# Patient Record
Sex: Male | Born: 1955 | Race: White | Hispanic: No | Marital: Single | Smoking: Former smoker
Health system: Southern US, Community
[De-identification: ages and names within clinical notes are randomized; demographics above are authoritative.]

## PROBLEM LIST (undated history)

## (undated) DIAGNOSIS — N400 Enlarged prostate without lower urinary tract symptoms: Secondary | ICD-10-CM

## (undated) DIAGNOSIS — I2510 Atherosclerotic heart disease of native coronary artery without angina pectoris: Secondary | ICD-10-CM

## (undated) DIAGNOSIS — I255 Ischemic cardiomyopathy: Secondary | ICD-10-CM

## (undated) DIAGNOSIS — C801 Malignant (primary) neoplasm, unspecified: Secondary | ICD-10-CM

## (undated) DIAGNOSIS — Z9289 Personal history of other medical treatment: Secondary | ICD-10-CM

## (undated) DIAGNOSIS — I480 Paroxysmal atrial fibrillation: Secondary | ICD-10-CM

## (undated) DIAGNOSIS — I714 Abdominal aortic aneurysm, without rupture, unspecified: Secondary | ICD-10-CM

## (undated) DIAGNOSIS — N189 Chronic kidney disease, unspecified: Secondary | ICD-10-CM

## (undated) DIAGNOSIS — E7800 Pure hypercholesterolemia, unspecified: Secondary | ICD-10-CM

## (undated) DIAGNOSIS — I219 Acute myocardial infarction, unspecified: Secondary | ICD-10-CM

## (undated) DIAGNOSIS — I10 Essential (primary) hypertension: Secondary | ICD-10-CM

## (undated) DIAGNOSIS — I509 Heart failure, unspecified: Secondary | ICD-10-CM

## (undated) DIAGNOSIS — D689 Coagulation defect, unspecified: Secondary | ICD-10-CM

## (undated) HISTORY — PX: VSD REPAIR: SHX276

## (undated) HISTORY — PX: CARDIAC CATHETERIZATION: SHX172

## (undated) HISTORY — DX: Abdominal aortic aneurysm, without rupture, unspecified: I71.40

## (undated) HISTORY — DX: Personal history of other medical treatment: Z92.89

## (undated) HISTORY — PX: CORONARY STENT PLACEMENT: SHX1402

## (undated) HISTORY — PX: CATARACT EXTRACTION W/ INTRAOCULAR LENS IMPLANT: SHX1309

## (undated) HISTORY — DX: Coagulation defect, unspecified: D68.9

## (undated) HISTORY — DX: Acute myocardial infarction, unspecified: I21.9

## (undated) HISTORY — PX: ASD REPAIR: SHX258

## (undated) HISTORY — PX: OTHER SURGICAL HISTORY: SHX169

---

## 2000-11-05 ENCOUNTER — Ambulatory Visit (HOSPITAL_COMMUNITY): Admission: RE | Admit: 2000-11-05 | Discharge: 2000-11-05 | Payer: Self-pay | Admitting: Family Medicine

## 2000-11-05 ENCOUNTER — Encounter: Payer: Self-pay | Admitting: Family Medicine

## 2000-11-25 ENCOUNTER — Encounter: Payer: Self-pay | Admitting: Family Medicine

## 2000-11-25 ENCOUNTER — Ambulatory Visit (HOSPITAL_COMMUNITY): Admission: RE | Admit: 2000-11-25 | Discharge: 2000-11-25 | Payer: Self-pay | Admitting: Family Medicine

## 2002-05-30 ENCOUNTER — Ambulatory Visit (HOSPITAL_COMMUNITY): Admission: RE | Admit: 2002-05-30 | Discharge: 2002-05-30 | Payer: Self-pay | Admitting: Family Medicine

## 2002-05-30 ENCOUNTER — Encounter: Payer: Self-pay | Admitting: Family Medicine

## 2004-02-29 ENCOUNTER — Ambulatory Visit (HOSPITAL_COMMUNITY): Admission: RE | Admit: 2004-02-29 | Discharge: 2004-02-29 | Payer: Self-pay | Admitting: Gastroenterology

## 2011-01-13 ENCOUNTER — Inpatient Hospital Stay (HOSPITAL_COMMUNITY)
Admission: EM | Admit: 2011-01-13 | Discharge: 2011-01-16 | DRG: 247 | Disposition: A | Discharge status: Home / Self Care | Payer: 59 | Source: Ambulatory Visit | Attending: Cardiovascular Disease | Admitting: Cardiovascular Disease

## 2011-01-13 ENCOUNTER — Emergency Department (HOSPITAL_COMMUNITY): Payer: 59

## 2011-01-13 DIAGNOSIS — E785 Hyperlipidemia, unspecified: Secondary | ICD-10-CM | POA: Diagnosis present

## 2011-01-13 DIAGNOSIS — I10 Essential (primary) hypertension: Secondary | ICD-10-CM | POA: Diagnosis present

## 2011-01-13 DIAGNOSIS — E876 Hypokalemia: Secondary | ICD-10-CM | POA: Diagnosis present

## 2011-01-13 DIAGNOSIS — R0609 Other forms of dyspnea: Secondary | ICD-10-CM | POA: Diagnosis present

## 2011-01-13 DIAGNOSIS — I219 Acute myocardial infarction, unspecified: Secondary | ICD-10-CM

## 2011-01-13 DIAGNOSIS — F17200 Nicotine dependence, unspecified, uncomplicated: Secondary | ICD-10-CM | POA: Diagnosis present

## 2011-01-13 DIAGNOSIS — I2109 ST elevation (STEMI) myocardial infarction involving other coronary artery of anterior wall: Principal | ICD-10-CM | POA: Diagnosis present

## 2011-01-13 DIAGNOSIS — R0989 Other specified symptoms and signs involving the circulatory and respiratory systems: Secondary | ICD-10-CM | POA: Diagnosis present

## 2011-01-13 DIAGNOSIS — R110 Nausea: Secondary | ICD-10-CM | POA: Diagnosis present

## 2011-01-13 HISTORY — DX: Acute myocardial infarction, unspecified: I21.9

## 2011-01-13 LAB — CBC
HCT: 46.5 % (ref 39.0–52.0)
Hemoglobin: 16.9 g/dL (ref 13.0–17.0)
MCH: 34.3 pg — ABNORMAL HIGH (ref 26.0–34.0)
MCHC: 36.3 g/dL — ABNORMAL HIGH (ref 30.0–36.0)
MCV: 94.3 fL (ref 78.0–100.0)
Platelets: 236 10*3/uL (ref 150–400)
RBC: 4.93 MIL/uL (ref 4.22–5.81)
RDW: 13.2 % (ref 11.5–15.5)
WBC: 9.3 10*3/uL (ref 4.0–10.5)

## 2011-01-13 LAB — DIFFERENTIAL
Basophils Absolute: 0.1 10*3/uL (ref 0.0–0.1)
Basophils Relative: 1 % (ref 0–1)
Eosinophils Absolute: 0.2 10*3/uL (ref 0.0–0.7)
Eosinophils Relative: 2 % (ref 0–5)
Lymphocytes Relative: 29 % (ref 12–46)
Lymphs Abs: 2.7 10*3/uL (ref 0.7–4.0)
Monocytes Absolute: 1 10*3/uL (ref 0.1–1.0)
Monocytes Relative: 11 % (ref 3–12)
Neutro Abs: 5.4 10*3/uL (ref 1.7–7.7)
Neutrophils Relative %: 58 % (ref 43–77)

## 2011-01-13 LAB — BASIC METABOLIC PANEL
BUN: 8 mg/dL (ref 6–23)
CO2: 26 mEq/L (ref 19–32)
Calcium: 9 mg/dL (ref 8.4–10.5)
Chloride: 90 mEq/L — ABNORMAL LOW (ref 96–112)
Creatinine, Ser: 0.66 mg/dL (ref 0.50–1.35)
GFR calc Af Amer: >90 mL/min (ref >90)
GFR calc non Af Amer: >90 mL/min (ref >90)
Glucose, Bld: 121 mg/dL — ABNORMAL HIGH (ref 70–99)
Potassium: 3.2 mEq/L — ABNORMAL LOW (ref 3.5–5.1)
Sodium: 128 mEq/L — ABNORMAL LOW (ref 135–145)

## 2011-01-13 LAB — CARDIAC PANEL(CRET KIN+CKTOT+MB+TROPI)
CK, MB: 110.7 ng/mL (ref 0.3–4.0)
Relative Index: 5.8 — ABNORMAL HIGH (ref 0.0–2.5)
Relative Index: 4.2 — ABNORMAL HIGH (ref 0.0–2.5)
Total CK: 3526 U/L — ABNORMAL HIGH (ref 7–232)
Troponin I: >25 ng/mL (ref <0.30)
Troponin I: >25 ng/mL (ref <0.30)

## 2011-01-13 LAB — HEMOGLOBIN A1C
Hgb A1c MFr Bld: 5.5 % (ref <5.7)
Mean Plasma Glucose: 111 mg/dL (ref <117)

## 2011-01-13 LAB — MAGNESIUM: Magnesium: 1.7 mg/dL (ref 1.5–2.5)

## 2011-01-13 LAB — APTT: aPTT: 28 seconds (ref 24–37)

## 2011-01-13 LAB — RENAL FUNCTION PANEL
Albumin: 3.8 g/dL (ref 3.5–5.2)
BUN: 7 mg/dL (ref 6–23)
Calcium: 9.2 mg/dL (ref 8.4–10.5)
Creatinine, Ser: 0.54 mg/dL (ref 0.50–1.35)
GFR calc non Af Amer: >90 mL/min (ref >90)

## 2011-01-13 LAB — PROTIME-INR
INR: 0.91 (ref 0.00–1.49)
Prothrombin Time: 12.4 seconds (ref 11.6–15.2)

## 2011-01-13 LAB — MRSA PCR SCREENING: MRSA by PCR: NEGATIVE

## 2011-01-13 LAB — LIPID PANEL: Cholesterol: 192 mg/dL (ref 0–200)

## 2011-01-13 LAB — PRO B NATRIURETIC PEPTIDE: Pro B Natriuretic peptide (BNP): 117.2 pg/mL (ref 0–125)

## 2011-01-14 LAB — CBC
HCT: 46.7 % (ref 39.0–52.0)
MCH: 33.9 pg (ref 26.0–34.0)
MCV: 94.7 fL (ref 78.0–100.0)
Platelets: 210 10*3/uL (ref 150–400)
RBC: 4.93 MIL/uL (ref 4.22–5.81)
WBC: 9.9 10*3/uL (ref 4.0–10.5)

## 2011-01-14 LAB — RENAL FUNCTION PANEL
BUN: 7 mg/dL (ref 6–23)
CO2: 24 mEq/L (ref 19–32)
Calcium: 9.2 mg/dL (ref 8.4–10.5)
Chloride: 98 mEq/L (ref 96–112)
Creatinine, Ser: 0.72 mg/dL (ref 0.50–1.35)

## 2011-01-14 LAB — CARDIAC PANEL(CRET KIN+CKTOT+MB+TROPI)
CK, MB: 45.9 ng/mL (ref 0.3–4.0)
Total CK: 1495 U/L — ABNORMAL HIGH (ref 7–232)

## 2011-01-15 ENCOUNTER — Inpatient Hospital Stay (HOSPITAL_COMMUNITY): Payer: 59

## 2011-01-15 LAB — BASIC METABOLIC PANEL
BUN: 9 mg/dL (ref 6–23)
CO2: 25 mEq/L (ref 19–32)
Calcium: 9 mg/dL (ref 8.4–10.5)
Creatinine, Ser: 0.73 mg/dL (ref 0.50–1.35)
GFR calc non Af Amer: >90 mL/min (ref >90)
Glucose, Bld: 132 mg/dL — ABNORMAL HIGH (ref 70–99)

## 2011-01-15 LAB — CBC
HCT: 46.1 % (ref 39.0–52.0)
Hemoglobin: 15.6 g/dL (ref 13.0–17.0)
MCH: 32.2 pg (ref 26.0–34.0)
MCHC: 33.8 g/dL (ref 30.0–36.0)
MCV: 95.1 fL (ref 78.0–100.0)
RBC: 4.85 MIL/uL (ref 4.22–5.81)

## 2011-01-15 LAB — CARDIAC PANEL(CRET KIN+CKTOT+MB+TROPI): CK, MB: 5 ng/mL — ABNORMAL HIGH (ref 0.3–4.0)

## 2011-01-16 NOTE — Cardiovascular Report (Signed)
Cory Valentine NO.:  [id number]  MEDICAL RECORD NO.:  [id number]  LOCATION:  2914                         FACILITY:  [facility]  PHYSICIAN:  Nanetta Batty, M.D.   DATE OF BIRTH:  1955/12/21  DATE OF PROCEDURE:  01/13/2011 DATE OF DISCHARGE:                           CARDIAC CATHETERIZATION   PROCEDURE:  Cardiac catheterization/PCI stent.  Mr. Cory Valentine is a 55 year old divorced Caucasian male with history of tobacco abuse, hypertension, who had chest pain this past Saturday and again this evening.  The pain began at 2 o'clock in the morning.  He was brought by EMS to [facility] where he was found to have an anterolateral myocardial infarction by EKG, and he was brought urgently to the cath lab for cath intervention.  PROCEDURE DESCRIPTION: The patient was brought to the Second Floor [facility] in the postabsorptive state.  He was not premedicated.  His right groin was prepped and shaved in usual sterile fashion.  Xylocaine 1% was used for local anesthesia.  A 6-French sheath was inserted into the right femoral artery using standard Seldinger technique.  6-French right Judkins catheter and one more 6-French XB LAD 3. 5 guiding catheter was used for selective coronary angiography.  A 6-French pigtail catheter was used for left ventriculography.  Visipaque dye was used for the entirety of the case.  Retrograde aortic, left ventricular, and pullback pressures were recorded.  HEMODYNAMICS: 1. Aortic systolic pressure was 79, diastolic pressure 57. 2. Left ventricular systolic pressure 88 and diastolic pressure 31.  SELECTIVE CORONARY ANGIOGRAPHY: 1. Left main normal. 2. LAD; occluded after the first septal perforator. 3. Left circumflex; dominant and free of significant disease. 4. Right coronary artery; nondominant with 60% stenosis in the     midportion after a small marginal branch. 5. Left ventriculography;  RAO left ventriculogram was  performed using     25 mL of Visipaque dye at 12 mL/second.  The overall LVEF was     estimated at 40-45% with severe anteroapical hypokinesia.  IMPRESSION:  Mr. Cory Valentine has an acute anterior wall myocardial infarction .  We will proceed with direct percutaneous coronary intervention and stenting using Angiomax and drug-eluting stent.  PROCEDURE DESCRIPTION:  The patient received 4 baby aspirin en route along with Effient 60 mg in the cath lab and Angiomax bolus with an ACT of 424.  The total contrast administered to the patient was 255 mL of contrast.  Using a 6-French XB LAD 3.5 guiding catheter along with an 0.14 x 190 Asahi soft wire and a 2.5 x 12 angioplasty balloon, pre dilatation was performed.  Door to balloon time was calculated 24 minutes.  Stenting was performed with 3.0 x 18 Resolute drug-eluting stent at 14 atmospheres and postdilatation of the proximal aspect of the stent was done with a 3.5 x 12 noncompliant balloon at 14 atmospheres (3.6 mm).  The final angiographic result with 0% residual with TIMI 3 flow.  The patient's chest pain resolved.  His EKG is somewhat improved. He was somewhat hypotensive during the case and was given IV fluids and low-dose dopamine.  Angiomax was continued until completion of the bag.  IMPRESSION:  Successful  left anterior descending coronary artery percutaneous coronary intervention and stenting in the setting of acute anterior myocardial infarction.  Door to balloon time of about 24 minutes.  The patient will be treated with aspirin, Effient, beta- blocker, ACE inhibitor, statin drug.  Smoking cessation consult will be pursued.  The patient will be hospitalized for approximately 3-4 days.     Nanetta Batty, M.D.     Cordelia Pen  D:  01/13/2011  T:  01/13/2011  Job:  [id number]  cc:   Second [facility] [place] [facility] [facility] [place]. Cory Valentine, M.D.  Electronically Signed by Nanetta Batty M.D. on  01/16/2011 05:27:33 PM

## 2011-03-25 DIAGNOSIS — Z9289 Personal history of other medical treatment: Secondary | ICD-10-CM

## 2011-03-25 HISTORY — PX: TRANSTHORACIC ECHOCARDIOGRAM: SHX275

## 2011-03-25 HISTORY — DX: Personal history of other medical treatment: Z92.89

## 2011-03-25 HISTORY — PX: NM MYOCAR PERF WALL MOTION: HXRAD629

## 2011-05-09 ENCOUNTER — Emergency Department (HOSPITAL_COMMUNITY)
Admission: EM | Admit: 2011-05-09 | Discharge: 2011-05-09 | Disposition: A | Discharge status: Home Health | Payer: 59 | Source: Home / Self Care | Attending: Family Medicine | Admitting: Family Medicine

## 2011-05-09 ENCOUNTER — Encounter (HOSPITAL_COMMUNITY): Payer: Self-pay | Admitting: Emergency Medicine

## 2011-05-09 DIAGNOSIS — B029 Zoster without complications: Secondary | ICD-10-CM

## 2011-05-09 HISTORY — DX: Essential (primary) hypertension: I10

## 2011-05-09 HISTORY — DX: Pure hypercholesterolemia, unspecified: E78.00

## 2011-05-09 MED ORDER — ACETAMINOPHEN-CODEINE #3 300-30 MG PO TABS: 1.0000 | ORAL_TABLET | Freq: Four times a day (QID) | ORAL | Status: AC | PRN

## 2011-05-09 MED ORDER — ACYCLOVIR 5 % EX OINT: TOPICAL_OINTMENT | CUTANEOUS | Status: DC

## 2011-05-09 NOTE — ED Notes (Signed)
Patient reports rash to left side of neck and left scalp.  Patient reports onset 6 days ago.  Reports aching and burning sensation.
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
## 2011-05-09 NOTE — Discharge Instructions (Signed)
GOOD HAND WASHING. AVOID PREGNANT WOMENShingles Shingles is caused by the same virus that causes chickenpox (varicella zoster virus or VZV). Shingles often occurs many years or decades after having chickenpox. That is why it is more common in adults older than 50 years. The virus reactivates and breaks out as an infection in a nerve root. SYMPTOMS   The initial feeling (sensations) may be pain. This pain is usually described as:   Burning.   Stabbing.   Throbbing.   Tingling in the nerve root.   A red rash will follow in a couple days. The rash may occur in any area of the body and is usually on one side (unilateral) of the body in a band or belt-like pattern. The rash usually starts out as very small blisters (vesicles). They will dry up after 7 to 10 days. This is not usually a significant problem except for the pain it causes.   Long-lasting (chronic) pain is more likely in an elderly person. It can last months to years. This condition is called postherpetic neuralgia.  Shingles can be an extremely severe infection in someone with AIDS, a weakened immune system, or with forms of leukemia. It can also be severe if you are taking transplant medicines or other medicines that weaken the immune system. TREATMENT  Your caregiver will often treat you with:  Antiviral drugs.   Anti-inflammatory drugs.   Pain medicines.  Bed rest is very important in preventing the pain associated with herpes zoster (postherpetic neuralgia). Application of heat in the form of a hot water bottle or electric heating pad or gentle pressure with the hand is recommended to help with the pain or discomfort. PREVENTION  A varicella zoster vaccine is available to help protect against the virus. The Food and Drug Administration approved the varicella zoster vaccine for individuals 69 years of age and older. HOME CARE INSTRUCTIONS   Cool compresses to the area of rash may be helpful.   Only take over-the-counter or  prescription medicines for pain, discomfort, or fever as directed by your caregiver.   Avoid contact with:   Babies.   Pregnant women.   Children with eczema.   Elderly people with transplants.   People with chronic illnesses, such as leukemia and AIDS.   If the area involved is on your face, you may receive a referral for follow-up to a specialist. It is very important to keep all follow-up appointments. This will help avoid eye complications, chronic pain, or disability.  SEEK IMMEDIATE MEDICAL CARE IF:   You develop any pain (headache) in the area of the face or eye. This must be followed carefully by your caregiver or ophthalmologist. An infection in part of your eye (cornea) can be very serious. It could lead to blindness.   You do not have pain relief from prescribed medicines.   Your redness or swelling spreads.   The area involved becomes very swollen and painful.   You have a fever.   You notice any red or painful lines extending away from the affected area toward your heart (lymphangitis).   Your condition is worsening or has changed.  Document Released: 03/02/2005 Document Revised: 11/12/2010 Document Reviewed: 02/04/2009 [facility] Patient Information 2012 [place], [place].

## 2012-10-30 ENCOUNTER — Other Ambulatory Visit: Payer: Self-pay | Admitting: Cardiovascular Disease

## 2012-10-31 NOTE — Telephone Encounter (Signed)
Rx was sent to pharmacy electronically. 

## 2012-11-24 ENCOUNTER — Encounter: Payer: Self-pay | Admitting: Cardiovascular Disease

## 2012-11-24 ENCOUNTER — Ambulatory Visit (INDEPENDENT_AMBULATORY_CARE_PROVIDER_SITE_OTHER): Payer: 59 | Admitting: Cardiovascular Disease

## 2012-11-24 VITALS — BP 110/80 | HR 79 | Ht 74.0 in | Wt 207.5 lb

## 2012-11-24 DIAGNOSIS — I2510 Atherosclerotic heart disease of native coronary artery without angina pectoris: Secondary | ICD-10-CM | POA: Insufficient documentation

## 2012-11-24 DIAGNOSIS — Z720 Tobacco use: Secondary | ICD-10-CM | POA: Insufficient documentation

## 2012-11-24 DIAGNOSIS — Z87891 Personal history of nicotine dependence: Secondary | ICD-10-CM

## 2012-11-24 DIAGNOSIS — I10 Essential (primary) hypertension: Secondary | ICD-10-CM | POA: Insufficient documentation

## 2012-11-24 DIAGNOSIS — E785 Hyperlipidemia, unspecified: Secondary | ICD-10-CM | POA: Insufficient documentation

## 2012-11-24 DIAGNOSIS — Z79899 Other long term (current) drug therapy: Secondary | ICD-10-CM

## 2012-11-24 MED ORDER — ATORVASTATIN CALCIUM 20 MG PO TABS: 20.0000 mg | ORAL_TABLET | Freq: Every day | ORAL | Status: DC

## 2012-11-24 NOTE — Assessment & Plan Note (Signed)
Well-controlled on current medications 

## 2012-11-24 NOTE — Assessment & Plan Note (Signed)
Status post anterior ST segment elevation myocardial infarction 01/13/11 it was setting of a "STEMI with an occluded proximal LAD status post stenting with a 3 mm x 18 [place] resolute drug-eluting stent. He had residual disease which was not thought to be significant. He had a left dominant system with an ejection fraction of 40-45% and moderate to severe anteroapical hypokinesia. His CPK was 3500 with an MB fraction of 203. EF improved by 2-D echocardiography 03/25/11-55% with moderate septal hypokinesia and a Myoview stress test showed anteroapical scar without ischemia. He denies chest pain or shortness of breath.

## 2012-11-24 NOTE — Progress Notes (Signed)
11/24/2012 Cory Valentine   [birth year]  [id number]  Primary Physician No PCP Per Patient Primary Cardiologist: Cory Gess MD Cory Valentine   HPI:  The patient is a very pleasant 57 year old thin and fit-appearing Caucasian male with no children who works at [employer]. I last saw him 6 months ago. His risk factors include a 50-pack-year history of tobacco abuse, having quit at the time of his myocardial infarction January 13, 2011, when I brought him to the catheterization lab at 4 a.m. in the setting of acute anterior wall myocardial infarction. I catheterized him, demonstrating an occluded proximal LAD, which I stented using a 3 x 18 mm long Resolute drug-eluting stent. He did have some diagonal branch disease as well as 80% mid nondominant RCA stenosis. His EF at that time was 40% to 45% with moderate to severe anteroapical hypokinesia. His peak CK was 3500 with an MB of 203 and troponin greater than 25. It has been over year since his last lipid profile, and at that time he had an LDL of 140. A followup echo performed March 25, 2011, revealed an EF of greater than 55% with moderate septal hypokinesia, and Myoview stress test showed anteroapical scar without ischemia. He denies chest pain or shortness of breath. We did transition him from Effient to Plavix with a P2Y12 level of 61 (PRU). Since I saw him 6 months ago he's been completely asymptomatic. He has not seen his primary care physician.     Current Outpatient Prescriptions  Medication Sig Dispense Refill  . aspirin 81 MG tablet Take 81 mg by mouth daily.      . clopidogrel (PLAVIX) 75 MG tablet Take 1 tablet by mouth daily.      Marland Kitchen lisinopril (PRINIVIL,ZESTRIL) 10 MG tablet TAKE 1 TABLET EVERY DAY  90 tablet  2  . atorvastatin (LIPITOR) 20 MG tablet Take 1 tablet (20 mg total) by mouth daily.  90 tablet  3   No current facility-administered medications for this visit.    No Known Allergies  History   Social  History  . Marital Status: Married    Spouse Name: N/A    Number of Children: N/A  . Years of Education: N/A   Occupational History  . Not on file.   Social History Main Topics  . Smoking status: Former Smoker -- 1.50 packs/day    Quit date: 01/13/2011  . Smokeless tobacco: Not on file  . Alcohol Use: Yes  . Drug Use: No  . Sexual Activity: Not on file   Other Topics Concern  . Not on file   Social History Narrative  . No narrative on file     Review of Systems: General: negative for chills, fever, night sweats or weight changes.  Cardiovascular: negative for chest pain, dyspnea on exertion, edema, orthopnea, palpitations, paroxysmal nocturnal dyspnea or shortness of breath Dermatological: negative for rash Respiratory: negative for cough or wheezing Urologic: negative for hematuria Abdominal: negative for nausea, vomiting, diarrhea, bright red blood per rectum, melena, or hematemesis Neurologic: negative for visual changes, syncope, or dizziness All other systems reviewed and are otherwise negative except as noted above.    Blood pressure 110/80, pulse 79, height 6\' 2"  (1.88 m), weight 207 lb 8 oz (94.121 kg).  General appearance: alert and no distress Neck: no adenopathy, no carotid bruit, no JVD, supple, symmetrical, trachea midline and thyroid not enlarged, symmetric, no tenderness/mass/nodules Lungs: clear to auscultation bilaterally Heart: regular rate and rhythm, S1, S2 normal,  no murmur, click, rub or gallop Extremities: extremities normal, atraumatic, no cyanosis or edema  EKG normal sinus rhythm at 79 with evidence of an old anterolateral myocardial infarction. He has Q waves in V1 through V4 with J-point elevation and anterolateral T wave inversion.  ASSESSMENT AND PLAN:   Coronary artery disease Status post anterior ST segment elevation myocardial infarction 01/13/11 it was setting of a "STEMI with an occluded proximal LAD status post stenting with a 3 mm x  18 [place] resolute drug-eluting stent. He had residual disease which was not thought to be significant. He had a left dominant system with an ejection fraction of 40-45% and moderate to severe anteroapical hypokinesia. His CPK was 3500 with an MB fraction of 203. EF improved by 2-D echocardiography 03/25/11-55% with moderate septal hypokinesia and a Myoview stress test showed anteroapical scar without ischemia. He denies chest pain or shortness of breath.  Essential hypertension Well-controlled on current medications  Hyperlipidemia Last LDL was 104 a year ago. He is not on a statin drug. I am going to start Lipitor 20 mg a day and recheck a lipid and liver profile in 2 months.      Cory Gess MD FACP,FACC,FAHA, [facility] 11/24/2012 9:05 AM

## 2012-11-24 NOTE — Patient Instructions (Addendum)
  We will see you back in follow up in 6 months with an extender and 1 year with Dr Allyson Sabal  Dr Allyson Sabal has ordered blood work to be done in 2 months, fasting.  Start lipitor 20mg  daily

## 2012-11-24 NOTE — Assessment & Plan Note (Signed)
Last LDL was 104 a year ago. He is not on a statin drug. I am going to start Lipitor 20 mg a day and recheck a lipid and liver profile in 2 months.

## 2012-12-18 IMAGING — CR DG CHEST 1V PORT
1 series · 1 of 1 positions shown · non-contrast
Comparison: None.

CLINICAL DATA: Chest pain

PORTABLE CHEST - 1 VIEW

[AP]
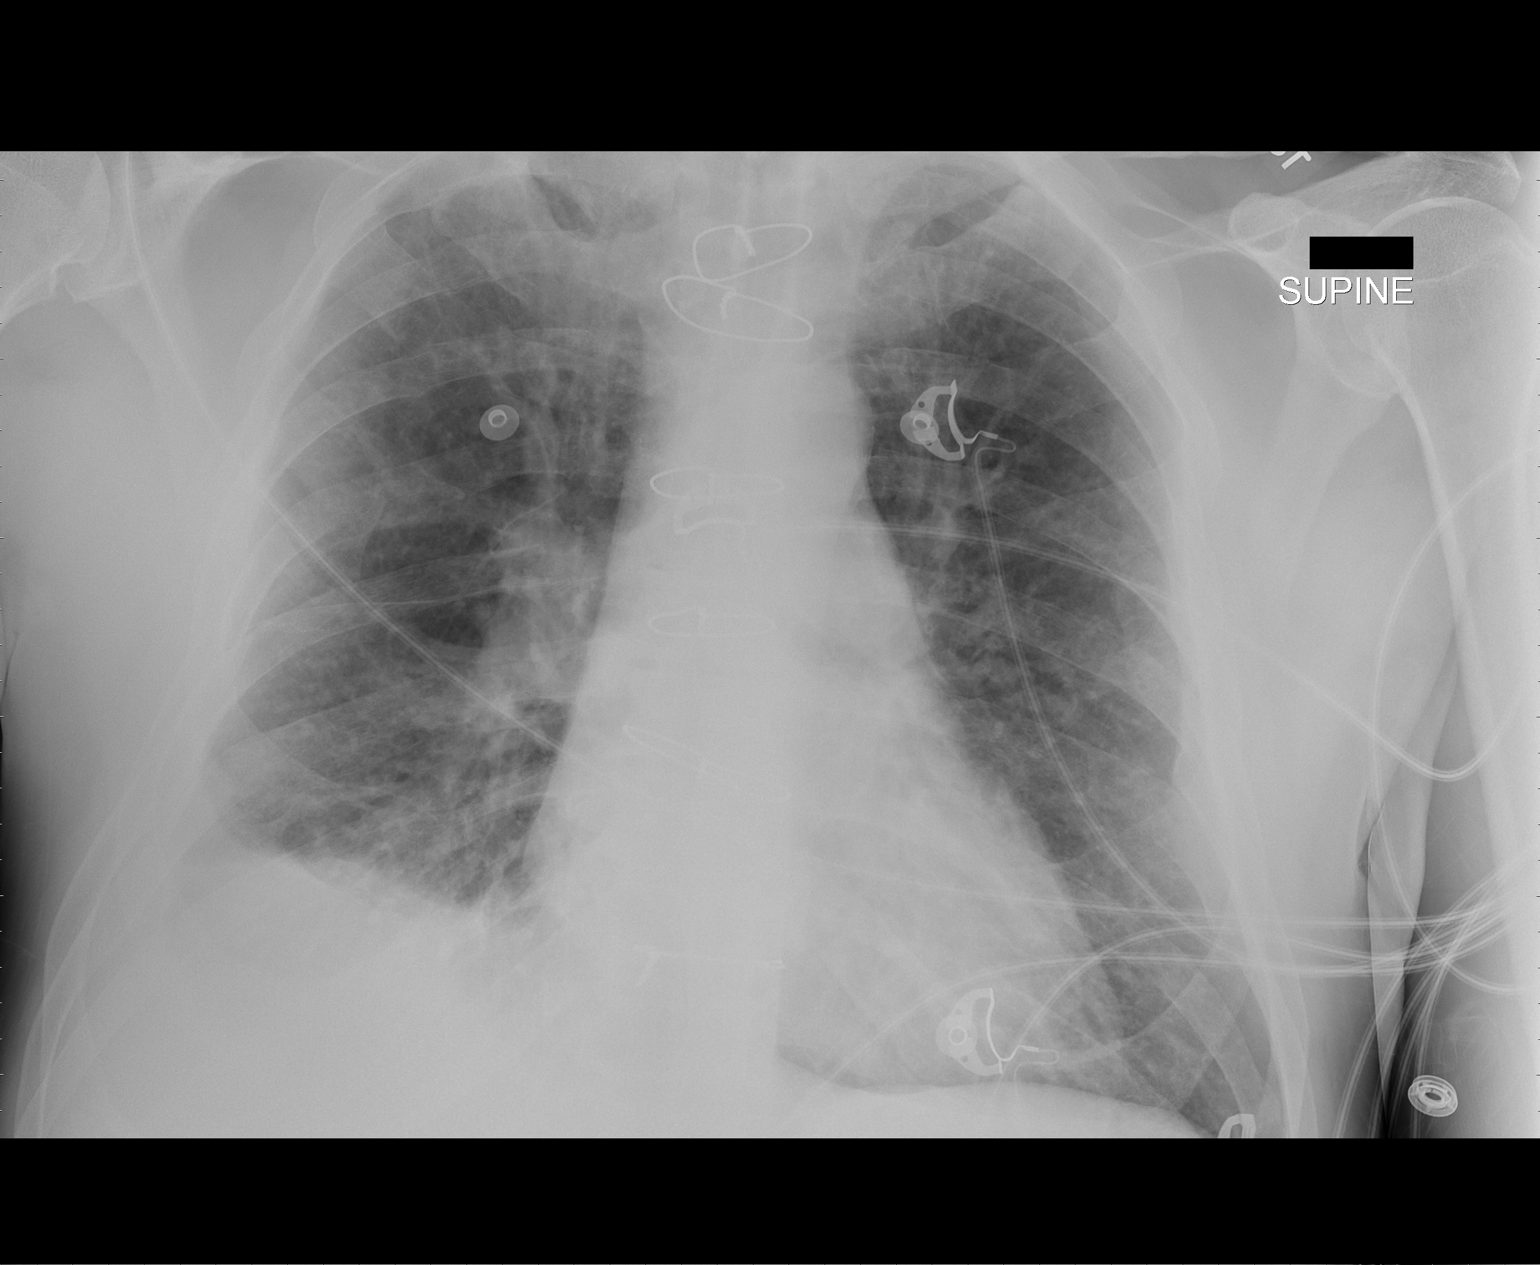

[1 of 1 positions shown; findings below may reference images not displayed]

FINDINGS: Postoperative changes in the mediastinum with sternotomy
wires present.  Old bilateral rib fractures.  Mild elevation of the
right hemidiaphragm with blunting of the right costophrenic angles
suggesting small effusion.  Increased density in the right lung
base may represent early airspace infiltration.  Findings suggest
pneumonia.  No pneumothorax.
IMPRESSION: Volume loss in the right lung with small right pleural effusion and
basilar infiltration or atelectasis.  Pneumonia should be excluded.

## 2012-12-20 IMAGING — CR DG CHEST 2V
2 series · 2 of 2 positions shown · non-contrast
Comparison: 01/13/2011

CLINICAL DATA: Myocardial infarction

CHEST - 2 VIEW

[w chest pa]
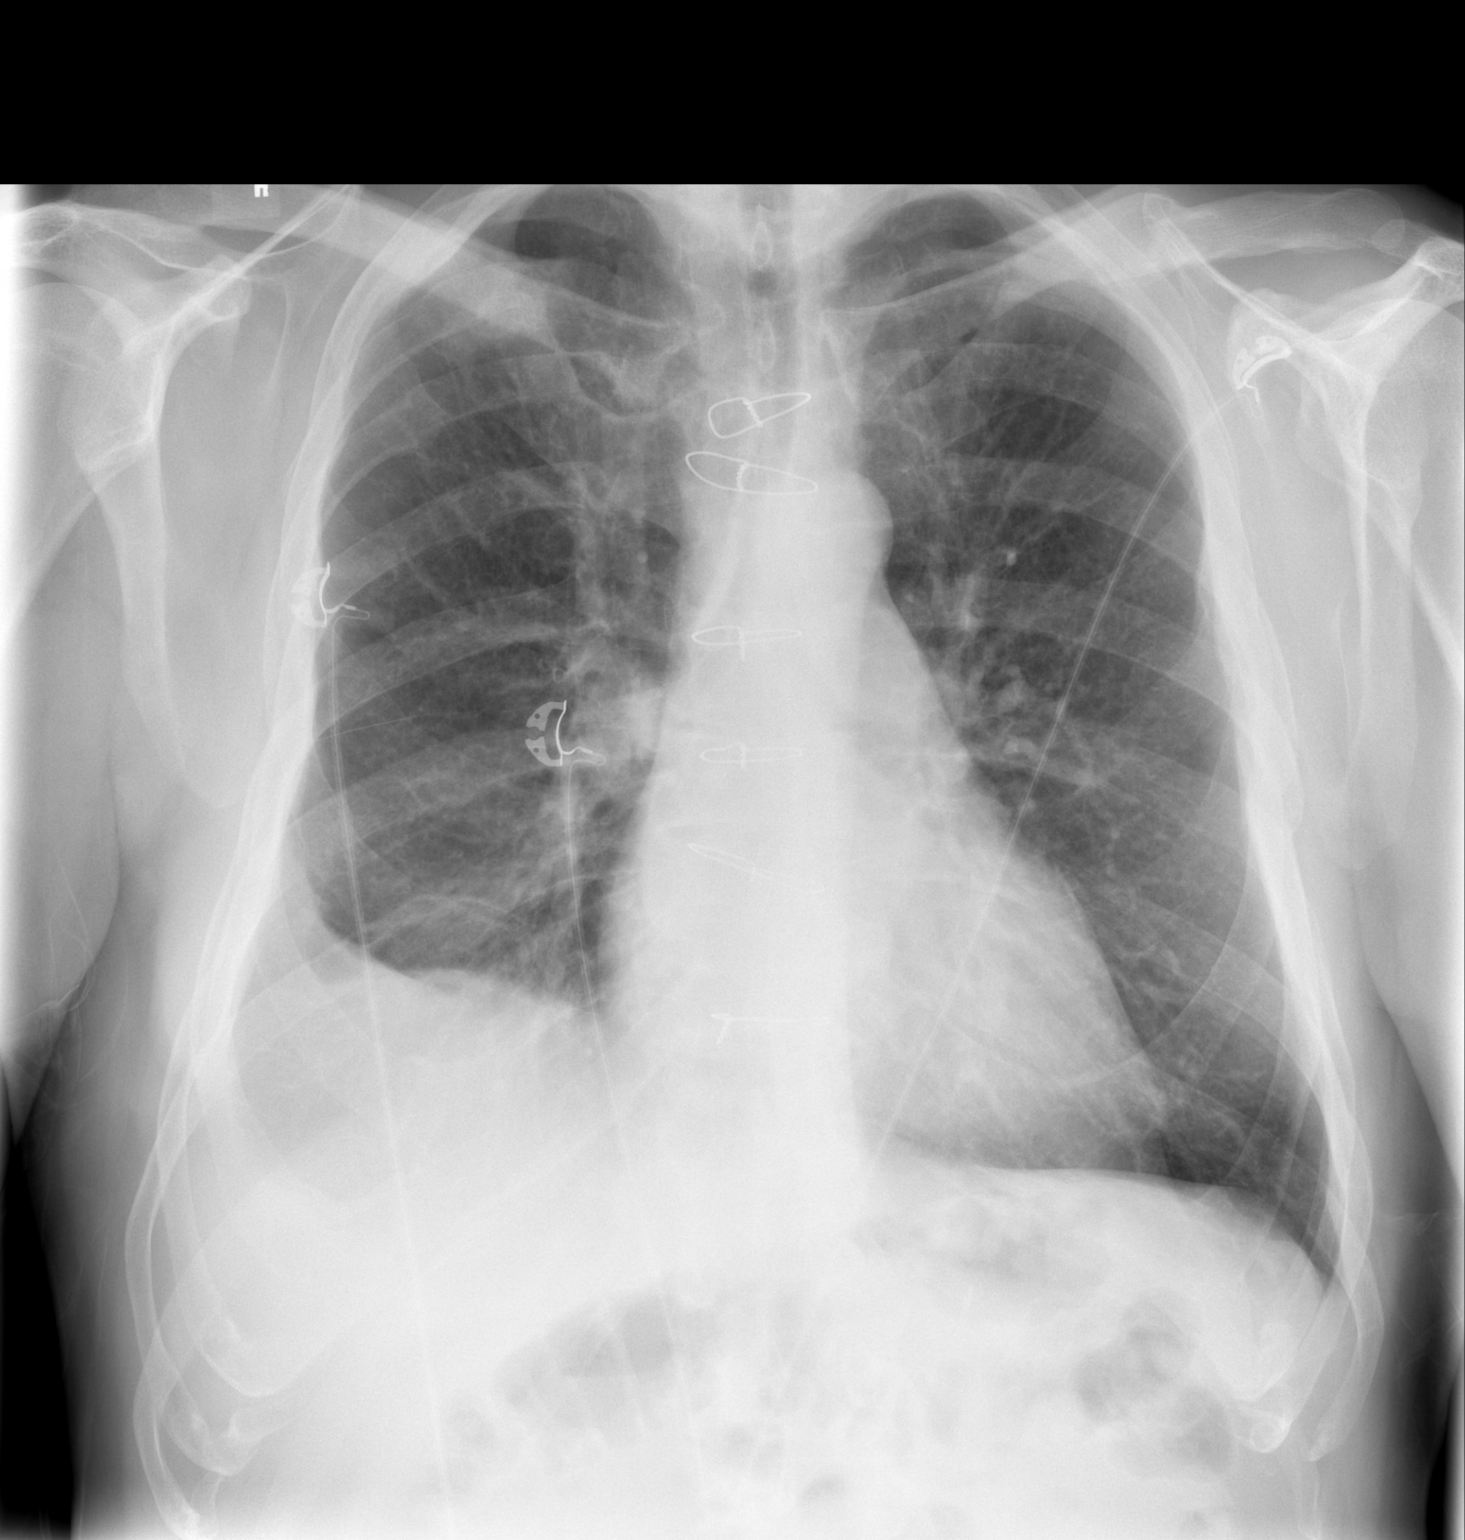

[w chest lat]
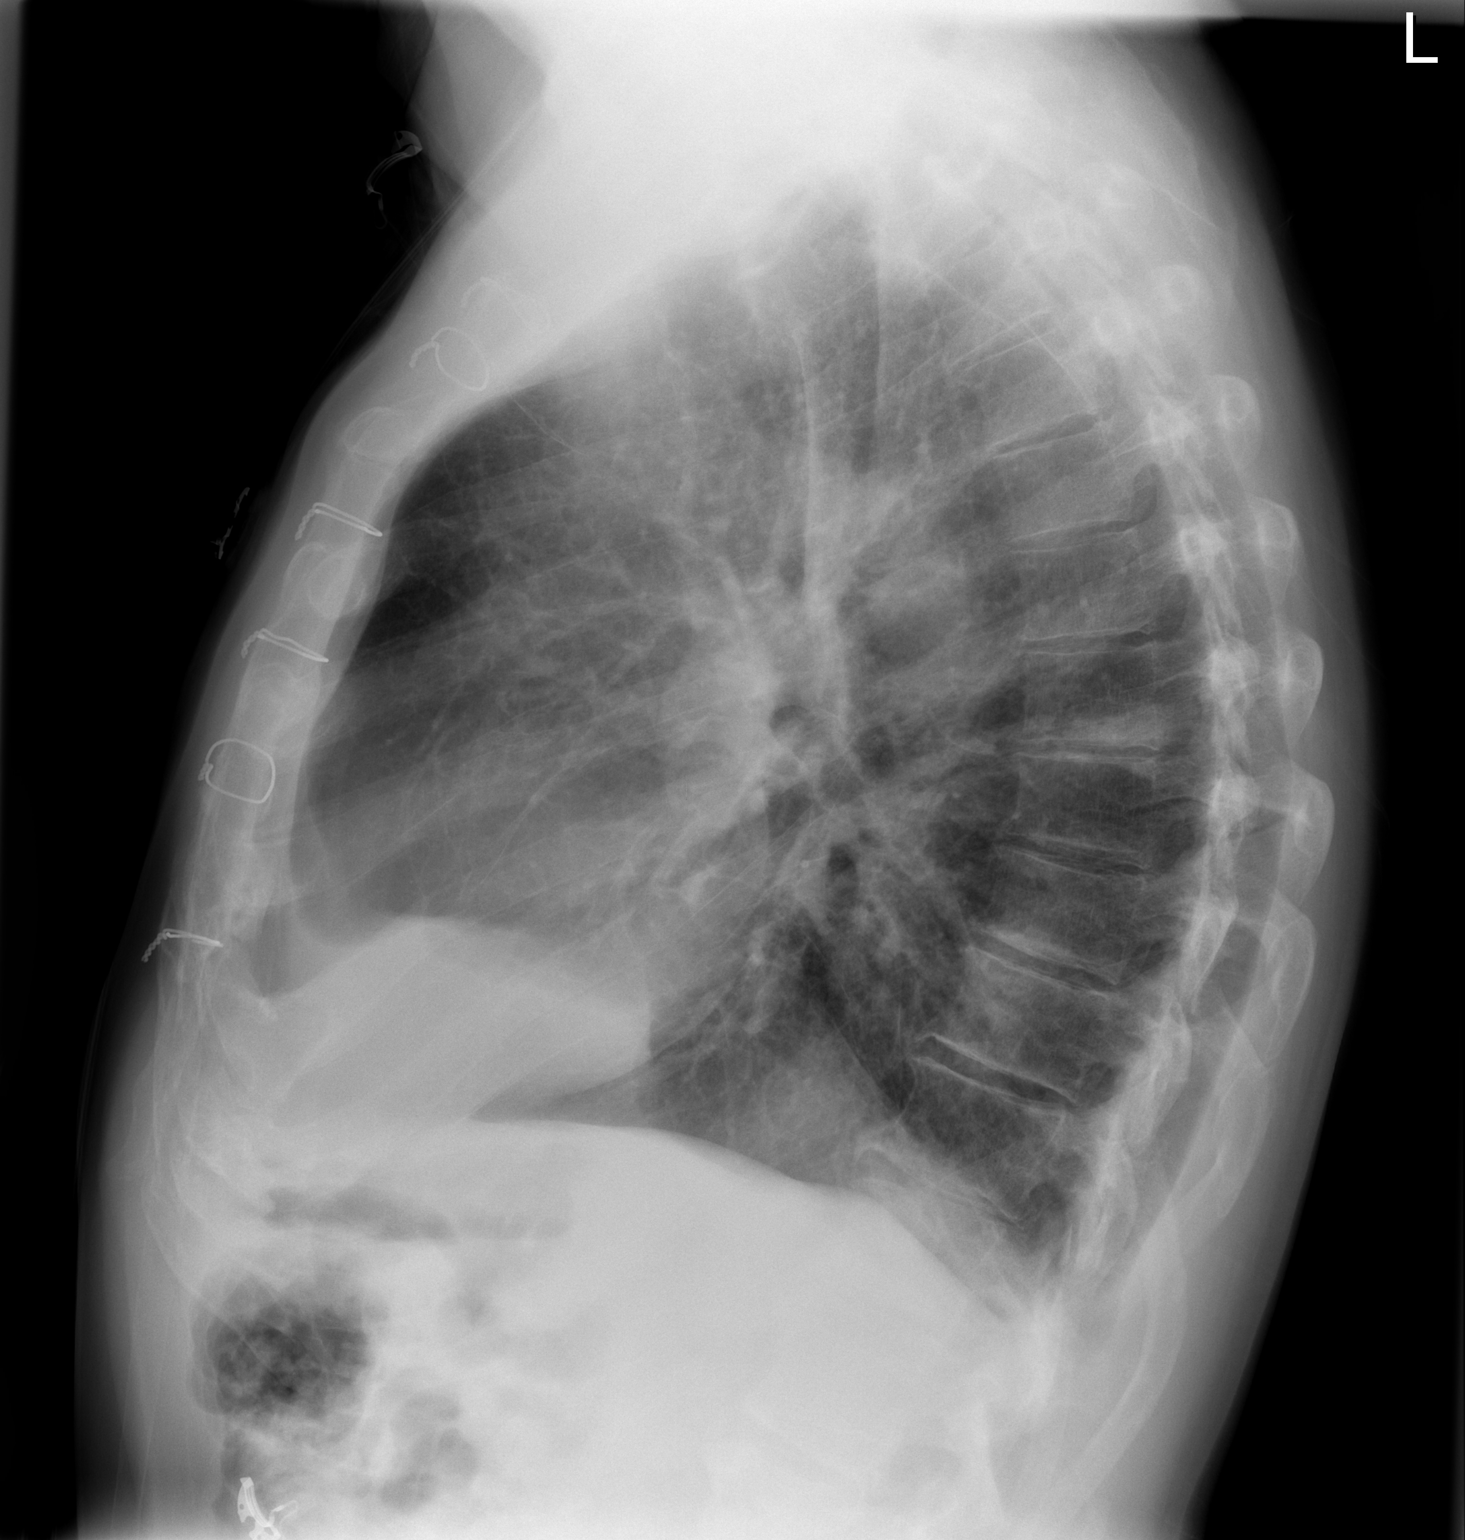

[2 of 2 positions shown; findings below may reference images not displayed]

FINDINGS: Blunting of the right costophrenic angle is associated
with opacity in the right middle lobe.  This likely represents a
combination of right middle lobe consolidation and possibly
loculated right pleural fluid.  Lungs are hyperaerated with
bronchitic changes.  Heart is normal in size.  Pulmonary
vascularity is within normal limits.  No pneumothorax.
IMPRESSION: Right middle lobe consolidation and right pleural effusion as
described.  Only recent radiographs are available for comparison.
The report from a radiograph dated [id number] does describe chronic
blunting of the right costophrenic angle and scarring.  The above
findings may represent chronic disease.

## 2013-04-24 ENCOUNTER — Other Ambulatory Visit: Payer: Self-pay | Admitting: Cardiovascular Disease

## 2013-04-24 NOTE — Telephone Encounter (Signed)
Rx was sent to pharmacy electronically. 

## 2013-09-05 ENCOUNTER — Other Ambulatory Visit: Payer: Self-pay | Admitting: *Deleted

## 2013-09-05 MED ORDER — LISINOPRIL 10 MG PO TABS: 10.0000 mg | ORAL_TABLET | Freq: Every day | ORAL | Status: DC

## 2013-09-05 NOTE — Telephone Encounter (Signed)
Rx refill sent to patient pharmacy   

## 2013-10-04 ENCOUNTER — Telehealth: Payer: Self-pay | Admitting: Cardiovascular Disease

## 2013-10-06 NOTE — Telephone Encounter (Signed)
Closed encounter °

## 2013-11-16 ENCOUNTER — Other Ambulatory Visit: Payer: Self-pay | Admitting: Cardiovascular Disease

## 2013-11-23 ENCOUNTER — Encounter: Payer: Self-pay | Admitting: *Deleted

## 2013-11-24 ENCOUNTER — Encounter: Payer: Self-pay | Admitting: Cardiovascular Disease

## 2013-11-24 ENCOUNTER — Ambulatory Visit (INDEPENDENT_AMBULATORY_CARE_PROVIDER_SITE_OTHER): Payer: 59 | Admitting: Cardiovascular Disease

## 2013-11-24 VITALS — BP 152/96 | HR 87 | Ht 74.0 in | Wt 206.3 lb

## 2013-11-24 DIAGNOSIS — I10 Essential (primary) hypertension: Secondary | ICD-10-CM

## 2013-11-24 DIAGNOSIS — E785 Hyperlipidemia, unspecified: Secondary | ICD-10-CM

## 2013-11-24 DIAGNOSIS — I2510 Atherosclerotic heart disease of native coronary artery without angina pectoris: Secondary | ICD-10-CM

## 2013-11-24 NOTE — Patient Instructions (Signed)
Your physician wants you to follow-up in: 1 year with Dr Berry. You will receive a reminder letter in the mail two months in advance. If you don't receive a letter, please call our office to schedule the follow-up appointment.  

## 2013-11-24 NOTE — Assessment & Plan Note (Signed)
History of CAD status post anterior STEMI 01/13/11 with proximal LAD stenting using a 3 mm x 18 mm long resolute drug-eluting stent. He currently has normal LV function and his last Myoview stress test performed January 2013 showed anteroapical scar with no ischemia. He denies chest pain or shortness of breath. He is on dual antibiotic therapy with aspirin Plavix and have a  P2Y12 tet revealing a PRU of 61.

## 2013-11-24 NOTE — Progress Notes (Signed)
11/24/2013 Cory Valentine   10/05/1955  [id number]  Primary Physician Anthoney Harada, MD Primary Cardiologist: Lorretta Harp MD Renae Gloss   HPI:  The patient is a very pleasant 58 year old thin and fit-appearing Caucasian male with no children who works at [employer]. I last saw him 12 months ago. His risk factors include a 50-pack-year history of tobacco abuse, having quit at the time of his myocardial infarction January 13, 2011, when I brought him to the catheterization lab at 4 a.m. in the setting of acute anterior wall myocardial infarction. I catheterized him, demonstrating an occluded proximal LAD, which I stented using a 3 x 18 mm long Resolute drug-eluting stent. He did have some diagonal branch disease as well as 80% mid nondominant RCA stenosis. His EF at that time was 40% to 45% with moderate to severe anteroapical hypokinesia. His peak CK was 3500 with an MB of 203 and troponin greater than 25. It has been over year since his last lipid profile, and at that time he had an LDL of 140. A followup echo performed March 25, 2011, revealed an EF of greater than 55% with moderate septal hypokinesia, and Myoview stress test showed anteroapical scar without ischemia. He denies chest pain or shortness of breath. We did transition him from Effient to Plavix with a P2Y12 level of 61 (PRU).  Since I saw him 12 months ago he's been completely asymptomatic. He has not seen his primary care physician.     Current Outpatient Prescriptions  Medication Sig Dispense Refill  . aspirin 81 MG tablet Take 81 mg by mouth daily.      Marland Kitchen atorvastatin (LIPITOR) 20 MG tablet Take 1 tablet (20 mg total) by mouth daily.  90 tablet  3  . clopidogrel (PLAVIX) 75 MG tablet TAKE 1 TABLET EVERY DAY TO REPLACE EFFIENT  90 tablet  2  . lisinopril (PRINIVIL,ZESTRIL) 10 MG tablet Take 1 tablet (10 mg total) by mouth daily.  90 tablet  2   No current facility-administered medications for this  visit.    No Known Allergies  History   Social History  . Marital Status: Married    Spouse Name: N/A    Number of Children: N/A  . Years of Education: N/A   Occupational History  . Not on file.   Social History Main Topics  . Smoking status: Former Smoker -- 1.50 packs/day    Quit date: 01/13/2011  . Smokeless tobacco: Never Used  . Alcohol Use: No  . Drug Use: No  . Sexual Activity: Not on file   Other Topics Concern  . Not on file   Social History Narrative  . No narrative on file     Review of Systems: General: negative for chills, fever, night sweats or weight changes.  Cardiovascular: negative for chest pain, dyspnea on exertion, edema, orthopnea, palpitations, paroxysmal nocturnal dyspnea or shortness of breath Dermatological: negative for rash Respiratory: negative for cough or wheezing Urologic: negative for hematuria Abdominal: negative for nausea, vomiting, diarrhea, bright red blood per rectum, melena, or hematemesis Neurologic: negative for visual changes, syncope, or dizziness All other systems reviewed and are otherwise negative except as noted above.    Blood pressure 152/96, pulse 87, height 6\' 2"  (1.88 m), weight 206 lb 4.8 oz (93.577 kg).  General appearance: alert and no distress Neck: no adenopathy, no carotid bruit, no JVD, supple, symmetrical, trachea midline and thyroid not enlarged, symmetric, no tenderness/mass/nodules Lungs: clear to auscultation bilaterally  Heart: regular rate and rhythm, S1, S2 normal, no murmur, click, rub or gallop Extremities: extremities normal, atraumatic, no cyanosis or edema  EKG normal sinus rhythm at 87 with septal Q waves  ASSESSMENT AND PLAN:   Coronary artery disease History of CAD status post anterior STEMI 01/13/11 with proximal LAD stenting using a 3 mm x 18 mm long resolute drug-eluting stent. He currently has normal LV function and his last Myoview stress test performed January 2013 showed  anteroapical scar with no ischemia. He denies chest pain or shortness of breath. He is on dual antibiotic therapy with aspirin Plavix and have a  P2Y12 tet revealing a PRU of 61.  Essential hypertension Mildly elevated today though he does admit to having "white coat hypertension"  Hyperlipidemia On statin therapy. He recently had his blood work checked at his place of work as part of a [employer] which he will forward to [place]      Lorretta Harp MD [place], [facility] 11/24/2013 9:48 AM

## 2013-11-24 NOTE — Assessment & Plan Note (Signed)
Mildly elevated today though he does admit to having "white coat hypertension"

## 2013-11-24 NOTE — Assessment & Plan Note (Addendum)
On statin therapy. He recently had his blood work checked at his place of work as part of a [employer] which he will forward to [place]

## 2013-12-08 ENCOUNTER — Other Ambulatory Visit: Payer: Self-pay | Admitting: Cardiovascular Disease

## 2013-12-08 NOTE — Telephone Encounter (Signed)
E sentrx 

## 2014-01-08 ENCOUNTER — Encounter: Payer: Self-pay | Admitting: Cardiovascular Disease

## 2014-05-29 ENCOUNTER — Other Ambulatory Visit: Payer: Self-pay | Admitting: Cardiovascular Disease

## 2014-05-29 NOTE — Telephone Encounter (Signed)
Rx(s) sent to pharmacy electronically.  

## 2014-07-25 ENCOUNTER — Telehealth: Payer: Self-pay | Admitting: Cardiovascular Disease

## 2014-07-25 ENCOUNTER — Ambulatory Visit (INDEPENDENT_AMBULATORY_CARE_PROVIDER_SITE_OTHER): Payer: 59 | Admitting: Internal Medicine

## 2014-07-25 ENCOUNTER — Encounter: Payer: Self-pay | Admitting: Internal Medicine

## 2014-07-25 VITALS — BP 98/58 | HR 87 | Ht 74.0 in | Wt 201.3 lb

## 2014-07-25 DIAGNOSIS — E785 Hyperlipidemia, unspecified: Secondary | ICD-10-CM | POA: Diagnosis not present

## 2014-07-25 DIAGNOSIS — Z87891 Personal history of nicotine dependence: Secondary | ICD-10-CM

## 2014-07-25 DIAGNOSIS — B349 Viral infection, unspecified: Secondary | ICD-10-CM

## 2014-07-25 DIAGNOSIS — I951 Orthostatic hypotension: Secondary | ICD-10-CM | POA: Diagnosis not present

## 2014-07-25 DIAGNOSIS — I2583 Coronary atherosclerosis due to lipid rich plaque: Principal | ICD-10-CM

## 2014-07-25 DIAGNOSIS — I2510 Atherosclerotic heart disease of native coronary artery without angina pectoris: Secondary | ICD-10-CM

## 2014-07-25 NOTE — Telephone Encounter (Signed)
Pt of Dr. Gwenlyn Found - CAD s/p anterior Stemi in 2012 w/ proximal LAD DES.  Contacted patient. He reports episode last night of chills, SOB, shaking all over. This happened about 1-2 hrs after going to bed and woke him from sleep. He recounts that this was similar to episode he had 4 years ago which signalled his heart attack.  He reports this AM, still feeling "beat up all over", SOB, "can't hardly move".  Pt requested evaluation - gave opening on Dr. Lysbeth Penner schedule at [facility] today after clearing w/ Eliezer Lofts.  Pt informed of time/loc/provider. Requested he re-direct to ED if return of CP/worse symptoms. Pt verbalized understanding.

## 2014-07-25 NOTE — Patient Instructions (Signed)
Dr. Debara Pickett recommends that you hold lisinopril for a few days - restart Monday May 16th >> stay hydrated >> finish your antibiotic (z-pak) >> if not better next week, please follow up with your primary care provider

## 2014-07-25 NOTE — Telephone Encounter (Signed)
Cory Valentine is calling because he had an episode of chest pains and deep sweating and lack of breath .Marland Kitchen Please call  Thanks
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
## 2014-07-27 ENCOUNTER — Telehealth: Payer: Self-pay | Admitting: *Deleted

## 2014-07-27 NOTE — Telephone Encounter (Signed)
Thanks .Marland Kitchen I would like to see if his EKG improved.

## 2014-07-27 NOTE — Telephone Encounter (Signed)
Called patient to inquire of current illness state. He reports he is doing better, still weak, not 100%. Informed patient that Dr. Debara Pickett wanted to have him do a repeat EKG check next week to make sure his EKG was same after he got over acute illness. Patient reports he will call back to scheduled EKG check when he feels better, latter part of next week or early the following week. (see Dr Lysbeth Penner note regarding EKG changes from EKG in Sept)

## 2014-07-30 NOTE — Addendum Note (Signed)
Addended by: Diana Eves on: 07/30/2014 01:06 PM   Modules accepted: Orders

## 2014-08-23 ENCOUNTER — Other Ambulatory Visit: Payer: Self-pay | Admitting: Cardiovascular Disease

## 2014-10-29 ENCOUNTER — Other Ambulatory Visit: Payer: Self-pay | Admitting: Cardiovascular Disease

## 2014-10-29 NOTE — Telephone Encounter (Signed)
Rx(s) sent to pharmacy electronically.  

## 2014-12-02 ENCOUNTER — Other Ambulatory Visit: Payer: Self-pay | Admitting: Cardiovascular Disease

## 2014-12-03 NOTE — Telephone Encounter (Signed)
REFILL 

## 2015-02-05 ENCOUNTER — Other Ambulatory Visit: Payer: Self-pay | Admitting: Cardiovascular Disease

## 2015-05-15 ENCOUNTER — Other Ambulatory Visit: Payer: Self-pay | Admitting: Cardiovascular Disease

## 2015-05-15 NOTE — Telephone Encounter (Signed)
Rx(s) sent to pharmacy electronically.  

## 2015-05-25 ENCOUNTER — Other Ambulatory Visit: Payer: Self-pay | Admitting: Cardiovascular Disease

## 2015-06-26 ENCOUNTER — Ambulatory Visit: Payer: 59 | Admitting: Cardiovascular Disease

## 2015-07-11 ENCOUNTER — Ambulatory Visit: Payer: 59 | Admitting: Physician Assistant

## 2015-07-17 ENCOUNTER — Ambulatory Visit (INDEPENDENT_AMBULATORY_CARE_PROVIDER_SITE_OTHER): Payer: 59 | Admitting: Cardiovascular Disease

## 2015-07-17 ENCOUNTER — Encounter: Payer: Self-pay | Admitting: Cardiovascular Disease

## 2015-07-17 VITALS — BP 168/86 | HR 72 | Ht 74.0 in | Wt 201.0 lb

## 2015-07-17 DIAGNOSIS — I2583 Coronary atherosclerosis due to lipid rich plaque: Secondary | ICD-10-CM

## 2015-07-17 DIAGNOSIS — I2510 Atherosclerotic heart disease of native coronary artery without angina pectoris: Secondary | ICD-10-CM

## 2015-07-17 DIAGNOSIS — E785 Hyperlipidemia, unspecified: Secondary | ICD-10-CM | POA: Diagnosis not present

## 2015-07-17 DIAGNOSIS — I10 Essential (primary) hypertension: Secondary | ICD-10-CM | POA: Diagnosis not present

## 2015-07-17 NOTE — Patient Instructions (Signed)
Medication Instructions:  Your physician recommends that you continue on your current medications as directed. Please refer to the Current Medication list given to you today.   Labwork: PLEASE FAX YOUR LAB WORK TO [phone] OR BRING COPIES BY OUR OFFICE AT YOUR EARLIEST CONVENIENCE.  Testing/Procedures: NONE  Follow-Up: Your physician wants you to follow-up in: [place].  You will receive a reminder letter in the mail two months in advance. If you don't receive a letter, please call our office to schedule the follow-up appointment.   Any Other Special Instructions Will Be Listed Below (If Applicable).     If you need a refill on your cardiac medications before your next appointment, please call your pharmacy.

## 2015-07-17 NOTE — Progress Notes (Signed)
07/17/2015 Cory Valentine   12/23/1955  [id number]  Primary Physician Karis Juba, PA-C Primary Cardiologist: Lorretta Harp MD Renae Gloss   HPI:  The patient is a very pleasant 60 year old thin and fit-appearing Caucasian male with no children who works at [employer]. I last saw him in the office 11/24/13.Marland Kitchen His risk factors include a 50-pack-year history of tobacco abuse, having quit at the time of his myocardial infarction January 13, 2011, when I brought him to the catheterization lab at 4 a.m. in the setting of acute anterior wall myocardial infarction. I catheterized him, demonstrating an occluded proximal LAD, which I stented using a 3 x 18 mm long Resolute drug-eluting stent. He did have some diagonal branch disease as well as 80% mid nondominant RCA stenosis. His EF at that time was 40% to 45% with moderate to severe anteroapical hypokinesia. His peak CK was 3500 with an MB of 203 and troponin greater than 25. It has been over year since his last lipid profile, and at that time he had an LDL of 140. A followup echo performed March 25, 2011, revealed an EF of greater than 55% with moderate septal hypokinesia, and Myoview stress test showed anteroapical scar without ischemia. He denies chest pain or shortness of breath. We did transition him from Effient to Plavix with a P2Y12 level of 61 (PRU).  Since I saw him 18 months ago he's been completely asymptomatic. Apparently he has a new primary care physician.   Current Outpatient Prescriptions  Medication Sig Dispense Refill  . aspirin 81 MG tablet Take 81 mg by mouth daily.    Marland Kitchen atorvastatin (LIPITOR) 20 MG tablet Take 1 tablet (20 mg total) by mouth daily. Please make appt for future refills. 90 tablet 0  . azithromycin (ZITHROMAX) 500 MG tablet Take 500 mg by mouth daily.    . clopidogrel (PLAVIX) 75 MG tablet Take 1 tablet (75 mg total) by mouth daily. <PLEASE MAKE APPOINTMENT FOR REFILLS> 90 tablet 0  . lisinopril  (PRINIVIL,ZESTRIL) 10 MG tablet TAKE 1 TABLET (10 MG TOTAL) BY MOUTH DAILY. 90 tablet 1   No current facility-administered medications for this visit.    No Known Allergies  Social History   Social History  . Marital Status: Married    Spouse Name: N/A  . Number of Children: N/A  . Years of Education: N/A   Occupational History  . Not on file.   Social History Main Topics  . Smoking status: Former Smoker -- 1.50 packs/day    Quit date: 01/13/2011  . Smokeless tobacco: Never Used  . Alcohol Use: No  . Drug Use: No  . Sexual Activity: Not on file   Other Topics Concern  . Not on file   Social History Narrative     Review of Systems: General: negative for chills, fever, night sweats or weight changes.  Cardiovascular: negative for chest pain, dyspnea on exertion, edema, orthopnea, palpitations, paroxysmal nocturnal dyspnea or shortness of breath Dermatological: negative for rash Respiratory: negative for cough or wheezing Urologic: negative for hematuria Abdominal: negative for nausea, vomiting, diarrhea, bright red blood per rectum, melena, or hematemesis Neurologic: negative for visual changes, syncope, or dizziness All other systems reviewed and are otherwise negative except as noted above.    Blood pressure 168/86, pulse 72, height 6\' 2"  (1.88 m), weight 201 lb (91.173 kg).  General appearance: alert and no distress Neck: no adenopathy, no carotid bruit, no JVD, supple, symmetrical, trachea midline and thyroid  not enlarged, symmetric, no tenderness/mass/nodules Lungs: clear to auscultation bilaterally Heart: regular rate and rhythm, S1, S2 normal, no murmur, click, rub or gallop Extremities: extremities normal, atraumatic, no cyanosis or edema  EKG normal sinus rhythm at 72 with evidence of an old anterolateral wall myocardial infarction. I personally reviewed this EKG  ASSESSMENT AND PLAN:   Coronary artery disease History of CAD status postacute  anterolateral wall myocardial infarction 01/13/11. Brought into the Cath Lab at 4 AM the setting of a STEMI. He had an occluded proximal LAD which I stented using a 3 mm x 18 mm long resolute drug-eluting stent. There was some diagonal branch disease as well as a 60% mid nondominant RCA stenosis At that time was 40-45% with moderate to severe anteroapical hypokinesia. His MB was 203 with a troponin greater than 25 at that time. Follow-up echo performed 03/25/11 revealed improved EF of 55% with moderate septal hypokinesia and a Myoview stress test showed anteroapical scar without ischemia. He denies chest pain or shortness of breath. With the transition him from Effient to Plavix.  Essential hypertension History of hypertension blood pressure measured at 168/86. He is on lisinopril. Continue current meds at current dosing  Hyperlipidemia History of hyperlipidemia on statin therapy followed at his place of work.      Lorretta Harp MD FACP,FACC,FAHA, [facility] 07/17/2015 10:06 AM

## 2015-07-17 NOTE — Assessment & Plan Note (Signed)
History of hypertension blood pressure measured at 168/86. He is on lisinopril. Continue current meds at current dosing

## 2015-07-17 NOTE — Assessment & Plan Note (Signed)
History of hyperlipidemia on statin therapy followed at his place of work.

## 2015-07-17 NOTE — Assessment & Plan Note (Signed)
History of CAD status postacute anterolateral wall myocardial infarction 01/13/11. Brought into the Cath Lab at 4 AM the setting of a STEMI. He had an occluded proximal LAD which I stented using a 3 mm x 18 mm long resolute drug-eluting stent. There was some diagonal branch disease as well as a 60% mid nondominant RCA stenosis At that time was 40-45% with moderate to severe anteroapical hypokinesia. His MB was 203 with a troponin greater than 25 at that time. Follow-up echo performed 03/25/11 revealed improved EF of 55% with moderate septal hypokinesia and a Myoview stress test showed anteroapical scar without ischemia. He denies chest pain or shortness of breath. With the transition him from Effient to Plavix.

## 2015-07-18 ENCOUNTER — Ambulatory Visit (INDEPENDENT_AMBULATORY_CARE_PROVIDER_SITE_OTHER): Payer: 59 | Admitting: Physician Assistant

## 2015-07-18 ENCOUNTER — Encounter: Payer: Self-pay | Admitting: Physician Assistant

## 2015-07-18 VITALS — BP 142/86 | HR 80 | Temp 98.1°F | Resp 18 | Ht 74.0 in | Wt 200.0 lb

## 2015-07-18 DIAGNOSIS — I2583 Coronary atherosclerosis due to lipid rich plaque: Secondary | ICD-10-CM

## 2015-07-18 DIAGNOSIS — Z23 Encounter for immunization: Secondary | ICD-10-CM | POA: Diagnosis not present

## 2015-07-18 DIAGNOSIS — I10 Essential (primary) hypertension: Secondary | ICD-10-CM

## 2015-07-18 DIAGNOSIS — Z0000 Encounter for general adult medical examination without abnormal findings: Secondary | ICD-10-CM | POA: Diagnosis not present

## 2015-07-18 DIAGNOSIS — E785 Hyperlipidemia, unspecified: Secondary | ICD-10-CM

## 2015-07-18 DIAGNOSIS — I2510 Atherosclerotic heart disease of native coronary artery without angina pectoris: Secondary | ICD-10-CM

## 2015-07-18 DIAGNOSIS — M5441 Lumbago with sciatica, right side: Secondary | ICD-10-CM

## 2015-07-18 LAB — CBC WITH DIFFERENTIAL/PLATELET
BASOS ABS: 59 {cells}/uL (ref 0–200)
Basophils Relative: 1 %
EOS ABS: 118 {cells}/uL (ref 15–500)
Eosinophils Relative: 2 %
HCT: 49.1 % (ref 38.5–50.0)
Hemoglobin: 16.9 g/dL (ref 13.0–17.0)
LYMPHS PCT: 38 %
Lymphs Abs: 2242 cells/uL (ref 850–3900)
MCH: 33.7 pg — AB (ref 27.0–33.0)
MCHC: 34.4 g/dL (ref 32.0–36.0)
MCV: 98 fL (ref 80.0–100.0)
MONOS PCT: 9 %
MPV: 9 fL (ref 7.5–12.5)
Monocytes Absolute: 531 cells/uL (ref 200–950)
NEUTROS PCT: 50 %
Neutro Abs: 2950 cells/uL (ref 1500–7800)
PLATELETS: 256 10*3/uL (ref 140–400)
RBC: 5.01 MIL/uL (ref 4.20–5.80)
RDW: 14.2 % (ref 11.0–15.0)
WBC: 5.9 10*3/uL (ref 3.8–10.8)

## 2015-07-18 LAB — TSH: TSH: 0.8 mIU/L (ref 0.40–4.50)

## 2015-07-18 LAB — LIPID PANEL
Cholesterol: 123 mg/dL — ABNORMAL LOW (ref 125–200)
HDL: 69 mg/dL (ref >=40)
LDL CALC: 46 mg/dL (ref <130)
TRIGLYCERIDES: 38 mg/dL (ref <150)
Total CHOL/HDL Ratio: 1.8 Ratio (ref <=5.0)
VLDL: 8 mg/dL (ref <30)

## 2015-07-18 LAB — COMPLETE METABOLIC PANEL WITH GFR
ALT: 25 U/L (ref 9–46)
AST: 27 U/L (ref 10–35)
Albumin: 4.9 g/dL (ref 3.6–5.1)
Alkaline Phosphatase: 75 U/L (ref 40–115)
BILIRUBIN TOTAL: 0.9 mg/dL (ref 0.2–1.2)
BUN: 8 mg/dL (ref 7–25)
CHLORIDE: 93 mmol/L — AB (ref 98–110)
CO2: 28 mmol/L (ref 20–31)
Calcium: 10.1 mg/dL (ref 8.6–10.3)
Creat: 0.71 mg/dL (ref 0.70–1.25)
Glucose, Bld: 102 mg/dL — ABNORMAL HIGH (ref 70–99)
Potassium: 4.6 mmol/L (ref 3.5–5.3)
Sodium: 133 mmol/L — ABNORMAL LOW (ref 135–146)
TOTAL PROTEIN: 7.2 g/dL (ref 6.1–8.1)

## 2015-07-18 MED ORDER — LISINOPRIL 20 MG PO TABS: 20.0000 mg | ORAL_TABLET | Freq: Every day | ORAL | Status: DC

## 2015-07-18 MED ORDER — LOSARTAN POTASSIUM 100 MG PO TABS: 100.0000 mg | ORAL_TABLET | Freq: Every day | ORAL | Status: DC

## 2015-07-18 MED ORDER — CYCLOBENZAPRINE HCL 10 MG PO TABS: 10.0000 mg | ORAL_TABLET | Freq: Every day | ORAL | Status: DC

## 2015-07-18 MED ORDER — PREDNISONE 20 MG PO TABS: ORAL_TABLET | ORAL | Status: DC
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
## 2015-07-19 LAB — PSA: PSA: 1.51 ng/mL (ref <=4.00)

## 2015-07-19 LAB — VITAMIN D 25 HYDROXY (VIT D DEFICIENCY, FRACTURES): Vit D, 25-Hydroxy: 35 ng/mL (ref 30–100)

## 2015-07-22 ENCOUNTER — Other Ambulatory Visit: Payer: Self-pay | Admitting: *Deleted

## 2015-07-22 MED ORDER — ATORVASTATIN CALCIUM 10 MG PO TABS: 10.0000 mg | ORAL_TABLET | Freq: Every day | ORAL | Status: DC

## 2015-07-27 ENCOUNTER — Encounter: Payer: Self-pay | Admitting: Physician Assistant

## 2015-08-02 ENCOUNTER — Other Ambulatory Visit: Payer: Self-pay | Admitting: Cardiovascular Disease

## 2015-08-02 NOTE — Telephone Encounter (Signed)
PT LISINOPRIL WAS D/C 07/18/15 AND SWITCH TO LOSARTAN

## 2015-08-05 ENCOUNTER — Ambulatory Visit (INDEPENDENT_AMBULATORY_CARE_PROVIDER_SITE_OTHER): Payer: 59 | Admitting: Physician Assistant

## 2015-08-05 ENCOUNTER — Encounter: Payer: Self-pay | Admitting: Physician Assistant

## 2015-08-05 VITALS — BP 136/82 | HR 84 | Temp 98.7°F | Resp 18 | Wt 202.0 lb

## 2015-08-05 DIAGNOSIS — I951 Orthostatic hypotension: Secondary | ICD-10-CM | POA: Diagnosis not present

## 2015-08-05 DIAGNOSIS — I10 Essential (primary) hypertension: Secondary | ICD-10-CM

## 2015-08-05 DIAGNOSIS — I2510 Atherosclerotic heart disease of native coronary artery without angina pectoris: Secondary | ICD-10-CM

## 2015-08-05 DIAGNOSIS — E785 Hyperlipidemia, unspecified: Secondary | ICD-10-CM

## 2015-08-05 DIAGNOSIS — M545 Low back pain, unspecified: Secondary | ICD-10-CM | POA: Insufficient documentation

## 2015-08-05 DIAGNOSIS — M5441 Lumbago with sciatica, right side: Secondary | ICD-10-CM | POA: Diagnosis not present

## 2015-08-05 DIAGNOSIS — M65842 Other synovitis and tenosynovitis, left hand: Secondary | ICD-10-CM

## 2015-08-05 DIAGNOSIS — I2583 Coronary atherosclerosis due to lipid rich plaque: Secondary | ICD-10-CM

## 2015-08-05 DIAGNOSIS — Z87891 Personal history of nicotine dependence: Secondary | ICD-10-CM
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
## 2015-08-06 ENCOUNTER — Ambulatory Visit
Admission: RE | Admit: 2015-08-06 | Discharge: 2015-08-06 | Disposition: A | Discharge status: Home / Self Care | Payer: 59 | Source: Ambulatory Visit | Attending: Physician Assistant | Admitting: Physician Assistant

## 2015-08-06 DIAGNOSIS — M5441 Lumbago with sciatica, right side: Secondary | ICD-10-CM

## 2015-08-06 LAB — BASIC METABOLIC PANEL WITH GFR
BUN: 12 mg/dL (ref 7–25)
CHLORIDE: 93 mmol/L — AB (ref 98–110)
CO2: 27 mmol/L (ref 20–31)
CREATININE: 0.89 mg/dL (ref 0.70–1.25)
Calcium: 9.1 mg/dL (ref 8.6–10.3)
GFR, Est African American: >89 mL/min (ref >=60)
GFR, Est Non African American: >89 mL/min (ref >=60)
GLUCOSE: 100 mg/dL — AB (ref 70–99)
POTASSIUM: 4.4 mmol/L (ref 3.5–5.3)
Sodium: 133 mmol/L — ABNORMAL LOW (ref 135–146)

## 2015-08-08 ENCOUNTER — Other Ambulatory Visit: Payer: Self-pay | Admitting: Physician Assistant

## 2015-08-08 ENCOUNTER — Other Ambulatory Visit: Payer: Self-pay | Admitting: Family Medicine

## 2015-08-08 DIAGNOSIS — M5441 Lumbago with sciatica, right side: Secondary | ICD-10-CM

## 2015-08-16 ENCOUNTER — Other Ambulatory Visit: Payer: Self-pay | Admitting: Cardiovascular Disease

## 2015-08-16 NOTE — Telephone Encounter (Signed)
Rx(s) sent to pharmacy electronically.  

## 2015-08-19 ENCOUNTER — Other Ambulatory Visit: Payer: Self-pay | Admitting: Physician Assistant

## 2015-08-20 ENCOUNTER — Other Ambulatory Visit: Payer: 59

## 2015-08-21 ENCOUNTER — Other Ambulatory Visit: Payer: Self-pay | Admitting: Cardiovascular Disease

## 2015-09-25 ENCOUNTER — Telehealth: Payer: Self-pay | Admitting: *Deleted

## 2015-09-25 NOTE — Telephone Encounter (Signed)
Requesting surgical clearance:   1. Type of surgery: Left ring finger:left hand palmar and digital fasciectomy ring and small finger, left little finger: fasciectomy partial palmar with release digit each additional  2. Surgeon: Dr Iran Planas  3. Surgical date: 10/16/2015   4. Medications that need to be held: Plavix and aspirin  5. CAD: yes     6. I will defer to: Dr Shella Spearing Fax- [phone]  Phone [phone]

## 2015-09-29 NOTE — Telephone Encounter (Signed)
OK to interrupt his anti platelet Rx for hand surgery

## 2015-10-01 NOTE — Telephone Encounter (Signed)
Patient cleared for surgery at low risk. May hold Plavix and aspirin for 7 days per pharmacy protocol. Message routed to number provided via EPIC.

## 2015-10-07 ENCOUNTER — Other Ambulatory Visit: Payer: Self-pay | Admitting: Family Medicine

## 2015-10-07 MED ORDER — ATORVASTATIN CALCIUM 10 MG PO TABS: 10.0000 mg | ORAL_TABLET | Freq: Every day | ORAL | 1 refills | Status: DC

## 2015-10-07 NOTE — Telephone Encounter (Signed)
Medication refilled per protocol. 

## 2015-10-16 ENCOUNTER — Other Ambulatory Visit: Payer: Self-pay | Admitting: Orthopedic Surgery

## 2015-10-29 ENCOUNTER — Other Ambulatory Visit: Payer: Self-pay | Admitting: Physician Assistant

## 2015-10-29 MED ORDER — ATORVASTATIN CALCIUM 10 MG PO TABS: 10.0000 mg | ORAL_TABLET | Freq: Every day | ORAL | 1 refills | Status: DC

## 2015-10-29 NOTE — Telephone Encounter (Signed)
Prescription sent to pharmacy.

## 2015-10-29 NOTE — Telephone Encounter (Signed)
[place] from [facility] called in regards to a Rx for Mr. Pinson. His insurance company is calling for a 3 month supply of Atorvastatin. [place] states they sent a request to our office without response. She'd like a phone call regarding this as the patient has been to the pharmacy and is upset the medication isn't ready for pick up.   [place]'s ph# [phone] Thank you.

## 2015-11-25 ENCOUNTER — Telehealth: Payer: Self-pay | Admitting: Physician Assistant

## 2015-11-25 NOTE — Telephone Encounter (Signed)
Cory Valentine called from [facility] stating patient is scheduled for MRI on 12/13/15 and the auth that is already attached states it is pending. Please call her if you have any questions.  CB# [phone]

## 2015-11-26 ENCOUNTER — Ambulatory Visit: Payer: 59 | Admitting: Family Medicine

## 2015-11-26 NOTE — Telephone Encounter (Signed)
MRI approved [id number] 11/26/15 - 01/10/16  Baker Janus called at [facility]

## 2015-12-12 ENCOUNTER — Telehealth: Payer: Self-pay | Admitting: *Deleted

## 2015-12-12 NOTE — Telephone Encounter (Signed)
Requesting surgical clearance:   1. Type of surgery: colonoscopy  2. Surgeon: Dr Amedeo Plenty  3. Surgical date: 01/09/2016  4. Medications that need to be held: Plavix and aspirin  5. CAD: Yes     6. I will defer to: Dr Windy Carina GI Fax- [phone] Phone [phone]

## 2015-12-12 NOTE — Telephone Encounter (Signed)
Okay to interrupt antiplatelet therapy for colonoscopy 

## 2015-12-12 NOTE — Telephone Encounter (Signed)
Patient cleared for surgery per Dr Gwenlyn Found. May hold Aspirin and Plavix up to 7 days prior to procedure per pharmacy protocol. Encounter routed to [facility] via EPIC.

## 2015-12-13 ENCOUNTER — Other Ambulatory Visit (HOSPITAL_COMMUNITY): Payer: Self-pay | Admitting: Neurosurgery

## 2015-12-13 ENCOUNTER — Ambulatory Visit
Admission: RE | Admit: 2015-12-13 | Discharge: 2015-12-13 | Disposition: A | Discharge status: Home / Self Care | Payer: 59 | Source: Ambulatory Visit | Attending: Physician Assistant | Admitting: Physician Assistant

## 2015-12-13 ENCOUNTER — Other Ambulatory Visit: Payer: Self-pay | Admitting: Neurosurgery

## 2015-12-13 DIAGNOSIS — M5441 Lumbago with sciatica, right side: Secondary | ICD-10-CM

## 2015-12-17 ENCOUNTER — Telehealth: Payer: Self-pay

## 2015-12-17 DIAGNOSIS — M545 Low back pain: Secondary | ICD-10-CM

## 2015-12-17 NOTE — Telephone Encounter (Signed)
Called pt to discuss MRI results.Processed a referral for a spine specialist

## 2015-12-18 ENCOUNTER — Telehealth: Payer: Self-pay

## 2015-12-18 NOTE — Telephone Encounter (Signed)
Pt called to see If his referral could be sent to spine specialist this year before his  benefits run out

## 2016-01-10 ENCOUNTER — Other Ambulatory Visit: Payer: Self-pay | Admitting: Cardiovascular Disease

## 2016-01-17 ENCOUNTER — Other Ambulatory Visit: Payer: Self-pay | Admitting: Physician Assistant

## 2016-02-11 ENCOUNTER — Ambulatory Visit (INDEPENDENT_AMBULATORY_CARE_PROVIDER_SITE_OTHER): Payer: 59 | Admitting: Cardiovascular Disease

## 2016-02-11 ENCOUNTER — Encounter: Payer: Self-pay | Admitting: Cardiovascular Disease

## 2016-02-11 VITALS — BP 148/94 | HR 86 | Ht 74.0 in | Wt 210.2 lb

## 2016-02-11 DIAGNOSIS — E7800 Pure hypercholesterolemia, unspecified: Secondary | ICD-10-CM

## 2016-02-11 DIAGNOSIS — I2510 Atherosclerotic heart disease of native coronary artery without angina pectoris: Secondary | ICD-10-CM

## 2016-02-11 DIAGNOSIS — I519 Heart disease, unspecified: Secondary | ICD-10-CM

## 2016-02-11 DIAGNOSIS — I10 Essential (primary) hypertension: Secondary | ICD-10-CM | POA: Diagnosis not present

## 2016-02-11 NOTE — Assessment & Plan Note (Signed)
History of hypertension blood pressure measures 148/94. He is on losartan. Continue current meds at current dosing

## 2016-02-11 NOTE — Patient Instructions (Signed)
Medication Instructions: Your physician recommends that you continue on your current medications as directed. Please refer to the Current Medication list given to you today.  Testing/Procedures: Your physician has requested that you have an echocardiogram. Echocardiography is a painless test that uses sound waves to create images of your heart. It provides your doctor with information about the size and shape of your heart and how well your heart's chambers and valves are working. This procedure takes approximately one hour. There are no restrictions for this procedure.  Follow-Up: Your physician wants you to follow-up in: 1 year with Dr. Berry. You will receive a reminder letter in the mail two months in advance. If you don't receive a letter, please call our office to schedule the follow-up appointment.  If you need a refill on your cardiac medications before your next appointment, please call your pharmacy.  

## 2016-02-11 NOTE — Progress Notes (Signed)
02/11/2016 Cory Valentine   12-Apr-1955  [id number]  Primary Physician Karis Juba, PA-C Primary Cardiologist: Lorretta Harp MD Renae Gloss  HPI:   The patient is a very pleasant 60 year old thin and fit-appearing Caucasian male with no children who works at [employer]. I last saw him in the office 07/17/15.Marland Kitchen His risk factors include a 50-pack-year history of tobacco abuse, having quit at the time of his myocardial infarction January 13, 2011, when I brought him to the catheterization lab at 4 a.m. in the setting of acute anterior wall myocardial infarction. I catheterized him, demonstrating an occluded proximal LAD, which I stented using a 3 x 18 mm long Resolute drug-eluting stent. He did have some diagonal branch disease as well as 80% mid nondominant RCA stenosis. His EF at that time was 40% to 45% with moderate to severe anteroapical hypokinesia. His peak CK was 3500 with an MB of 203 and troponin greater than 25. It has been over year since his last lipid profile, and at that time he had an LDL of 140. A followup echo performed March 25, 2011, revealed an EF of greater than 55% with moderate septal hypokinesia, and Myoview stress test showed anteroapical scar without ischemia. He denies chest pain or shortness of breath. We did transition him from Effient to Plavix with a P2Y12 level of 61 (PRU). Since I saw him 6 months ago he noticed increasing shortness of breath while bear hunting several weeks ago in the setting of an upper respiratory tract infection. This has since cleared and his breathing has improved.    Current Outpatient Prescriptions  Medication Sig Dispense Refill  . aspirin 81 MG tablet Take 81 mg by mouth daily.    Marland Kitchen atorvastatin (LIPITOR) 10 MG tablet Take 1 tablet (10 mg total) by mouth daily at 6 PM. 90 tablet 1  . clopidogrel (PLAVIX) 75 MG tablet Take 1 tablet (75 mg total) by mouth daily. 90 tablet 3  . cyclobenzaprine (FLEXERIL) 10 MG tablet Take  1 tablet (10 mg total) by mouth at bedtime. 30 tablet 0  . losartan (COZAAR) 100 MG tablet TAKE 1 TABLET BY MOUTH DAILY 90 tablet 1   No current facility-administered medications for this visit.     No Known Allergies  Social History   Social History  . Marital status: Married    Spouse name: N/A  . Number of children: N/A  . Years of education: N/A   Occupational History  . Not on file.   Social History Main Topics  . Smoking status: Former Smoker    Packs/day: 1.50    Quit date: 01/13/2011  . Smokeless tobacco: Never Used  . Alcohol use No  . Drug use: No  . Sexual activity: Not on file   Other Topics Concern  . Not on file   Social History Narrative  . No narrative on file     Review of Systems: General: negative for chills, fever, night sweats or weight changes.  Cardiovascular: negative for chest pain, dyspnea on exertion, edema, orthopnea, palpitations, paroxysmal nocturnal dyspnea or shortness of breath Dermatological: negative for rash Respiratory: negative for cough or wheezing Urologic: negative for hematuria Abdominal: negative for nausea, vomiting, diarrhea, bright red blood per rectum, melena, or hematemesis Neurologic: negative for visual changes, syncope, or dizziness All other systems reviewed and are otherwise negative except as noted above.    Blood pressure (!) 148/94, pulse 86, height 6\' 2"  (1.88 m), weight 210  lb 3.2 oz (95.3 kg).  General appearance: alert and no distress Neck: no adenopathy, no carotid bruit, no JVD, supple, symmetrical, trachea midline and thyroid not enlarged, symmetric, no tenderness/mass/nodules Lungs: clear to auscultation bilaterally Heart: regular rate and rhythm, S1, S2 normal, no murmur, click, rub or gallop Extremities: extremities normal, atraumatic, no cyanosis or edema  EKG sinus rhythm 86 with septal Q waves and nonspecific ST and T-wave changes. I personally reviewed this EKG  ASSESSMENT AND PLAN:    Coronary artery disease History of coronary artery disease status post acute anterior wall myocardial infarction 01/13/11. I stented his proximal LAD using a 3 mm x 18 mm long resolute drug-eluting stent. He did have some diagonal branch disease as well as an 80% mid nondominant RCA. EF at that time was 40-45% with moderate to severe anteroapical hypokinesia. His CPK leak with 3500 with an MB of 203 and a troponin greater than 25 year follow-up echo performed 03/25/11 revealed EF of greater than 55% with moderate septal hypokinesia and Myoview showed anteroapical scar without ischemia. He denies chest pain but recently noticed increasing shortness of breath while bear hunting. He was suffering from an upper respiratory tract infection simultaneously however. I am going to check a 2-D echocardiogram.  Essential hypertension History of hypertension blood pressure measures 148/94. He is on losartan. Continue current meds at current dosing  Hyperlipidemia History of hyperlipidemia on statin therapy with recent lipid profile performed 07/18/15 revealed total cholesterol 123, LDL of 46 and HDL of 69.      Lorretta Harp MD FACP,FACC,FAHA, [facility] 02/11/2016 12:53 PM

## 2016-02-11 NOTE — Assessment & Plan Note (Signed)
History of hyperlipidemia on statin therapy with recent lipid profile performed 07/18/15 revealed total cholesterol 123, LDL of 46 and HDL of 69.

## 2016-02-11 NOTE — Assessment & Plan Note (Signed)
History of coronary artery disease status post acute anterior wall myocardial infarction 01/13/11. I stented his proximal LAD using a 3 mm x 18 mm long resolute drug-eluting stent. He did have some diagonal branch disease as well as an 80% mid nondominant RCA. EF at that time was 40-45% with moderate to severe anteroapical hypokinesia. His CPK leak with 3500 with an MB of 203 and a troponin greater than 25 year follow-up echo performed 03/25/11 revealed EF of greater than 55% with moderate septal hypokinesia and Myoview showed anteroapical scar without ischemia. He denies chest pain but recently noticed increasing shortness of breath while bear hunting. He was suffering from an upper respiratory tract infection simultaneously however. I am going to check a 2-D echocardiogram.

## 2016-02-12 ENCOUNTER — Encounter: Payer: Self-pay | Admitting: Family Medicine

## 2016-02-12 ENCOUNTER — Ambulatory Visit (INDEPENDENT_AMBULATORY_CARE_PROVIDER_SITE_OTHER): Payer: 59 | Admitting: Family Medicine

## 2016-02-12 VITALS — BP 128/68 | HR 98 | Temp 98.3°F | Resp 18 | Ht 74.0 in | Wt 212.0 lb

## 2016-02-12 DIAGNOSIS — J209 Acute bronchitis, unspecified: Secondary | ICD-10-CM | POA: Diagnosis not present

## 2016-02-12 MED ORDER — AZITHROMYCIN 250 MG PO TABS
ORAL_TABLET | ORAL | 0 refills | Status: DC
Start: 2016-02-12 — End: 2016-10-16

## 2016-02-12 MED ORDER — PREDNISONE 20 MG PO TABS: 20.0000 mg | ORAL_TABLET | Freq: Every day | ORAL | 0 refills | Status: DC

## 2016-02-12 MED ORDER — ALBUTEROL SULFATE HFA 108 (90 BASE) MCG/ACT IN AERS: 2.0000 | INHALATION_SPRAY | RESPIRATORY_TRACT | 0 refills | Status: DC | PRN

## 2016-02-12 NOTE — Patient Instructions (Signed)
Take antibiotics Steroids and albuterol inhaler  Call if not improved F/U as needed

## 2016-02-12 NOTE — Progress Notes (Signed)
   Subjective:    Patient ID: Cory Valentine, male    DOB: [birth year], 60 y.o.   MRN: [id number]  Patient presents for Chest congestion, cough  Patient here with chest congestion and cough for the past week. He's had some increased shortness of breath and wheezing as well. He had low-grade fever in the beginning. [place] time he comes [place] he tends to get sick. He was seen by his cardiologist yesterday but his lungs were fairly clear. He feels like the wheezing is worse in the evening he gets really congested in the morning. He is a nonsmoker. He's been taking airborne over-the-counter and Zicam  Review Of Systems:  GEN- denies fatigue, +fever, weight loss,weakness, recent illness HEENT- denies eye drainage, change in vision, nasal discharge, CVS- denies chest pain, palpitations RESP- denies SOB, +cough,+ wheeze ABD- denies N/V, change in stools, abd pain GU- denies dysuria, hematuria, dribbling, incontinence MSK- denies joint pain, muscle aches, injury Neuro- denies headache, dizziness, syncope, seizure activity       Objective:    BP 128/68   Pulse 98   Temp 98.3 F (36.8 C) (Oral)   Resp 18   Ht 6\' 2"  (1.88 m)   Wt 212 lb (96.2 kg)   SpO2 98%   BMI 27.22 kg/m  GEN- NAD, alert and oriented x3 HEENT- PERRL, EOMI, non injected sclera, pink conjunctiva, MMM, oropharynx  Clear , TM clear bilat no effusion, no  maxillary sinus tenderness, inflammed turbinates,  Nasal drainage  Neck- Supple, no LAD CVS- RRR, no murmur RESP-bilat wheeze, mild rhonchi upper aiway, normal WOB, no retractions  EXT- No edema Pulses- Radial 2+         Assessment & Plan:      Problem List Items Addressed This Visit    None    Visit Diagnoses    Acute bronchitis, unspecified organism    -  Primary   Treat with zpak, prednisone low dose, albuterol for the tighntness SOB. CXR if not improved       Note: This dictation was prepared with Dragon dictation  along with smaller phrase technology. Any transcriptional errors that result from this process are unintentional.

## 2016-02-18 ENCOUNTER — Encounter: Payer: Self-pay | Admitting: Cardiovascular Disease

## 2016-02-18 ENCOUNTER — Telehealth: Payer: Self-pay | Admitting: Cardiovascular Disease

## 2016-02-18 NOTE — Telephone Encounter (Signed)
Okay to interrupt antiplatelet therapy for spinal injection. 

## 2016-02-18 NOTE — Telephone Encounter (Signed)
Clearance faxed to number provided via EPIC. 

## 2016-02-18 NOTE — Telephone Encounter (Signed)
Requesting surgical clearance:  1. Type of procedure: Right L3-4 Transforaminal Epidural Steroid Injection  2. Surgeon: Dr. Marlaine Hind  3.Surgical Date: 03/04/16 at 9 am  4. Medications that need to be held: ASA and Plavix for 6 days prior   5. CAD: Yes  6. I will defer to:  Dr. Pearla Dubonnet Information:  Fax: [phone]

## 2016-03-03 ENCOUNTER — Encounter (HOSPITAL_COMMUNITY): Payer: Self-pay | Admitting: *Deleted

## 2016-03-03 ENCOUNTER — Other Ambulatory Visit: Payer: Self-pay

## 2016-03-03 ENCOUNTER — Ambulatory Visit (HOSPITAL_COMMUNITY): Payer: 59 | Attending: Cardiovascular Disease

## 2016-03-03 DIAGNOSIS — I519 Heart disease, unspecified: Secondary | ICD-10-CM | POA: Diagnosis present

## 2016-03-03 NOTE — Progress Notes (Signed)
Patient declined the use of Definity at the time of the echocardiogram. He wants Dr. Gwenlyn Found to decide whether his echocardiogram would benefit from Definity.

## 2016-04-16 ENCOUNTER — Other Ambulatory Visit: Payer: Self-pay | Admitting: Family Medicine

## 2016-04-16 ENCOUNTER — Telehealth: Payer: Self-pay | Admitting: Cardiovascular Disease

## 2016-04-16 NOTE — Telephone Encounter (Signed)
Requesting surgical clearance:  1. Type of surgery: ESI Lumbar  2. Surgeon: Marlaine Hind, MD  3.Surgical Date: pending  4. Medications that need to be held: Plavix and ASA 81--7 days prior to procedure   5. CAD: Yes  6. I will defer to:  Dr. Pearla Dubonnet Information:   [facility] Phone:  [phone]  Fax:  [phone]

## 2016-04-22 NOTE — Telephone Encounter (Signed)
Okay to interrupt his antiplatelet therapy for his lumbar surgery

## 2016-04-24 NOTE — Telephone Encounter (Signed)
Routed to number provided via EPIC. 

## 2016-06-09 ENCOUNTER — Other Ambulatory Visit: Payer: Self-pay | Admitting: Neurosurgery

## 2016-06-09 DIAGNOSIS — M4180 Other forms of scoliosis, site unspecified: Secondary | ICD-10-CM

## 2016-06-09 DIAGNOSIS — M4150 Other secondary scoliosis, site unspecified: Principal | ICD-10-CM

## 2016-06-22 ENCOUNTER — Telehealth: Payer: Self-pay | Admitting: Cardiovascular Disease

## 2016-06-22 NOTE — Telephone Encounter (Signed)
Routed to [place] to confirm receipt of paperwork.

## 2016-06-22 NOTE — Telephone Encounter (Signed)
New message    Cory Valentine from [facility] is calling to find out if faxes were received from them.

## 2016-06-26 NOTE — Telephone Encounter (Signed)
Fax received.  Requesting surgical clearance:  1. Type of surgery: Laser Spine Surgery  2. Surgeon: not specified  3.Surgical Date:  pending  4. Medications that need to be held: ASA 81 and Plavix   5. CAD: Yes  6. I will defer to:  Dr. Pearla Dubonnet Information:  [facility] Phone:  [phone] ext: [phone]  Fax:  [phone]

## 2016-06-26 NOTE — Telephone Encounter (Signed)
Okay to interrupt antiplatelet therapy for laser spine surgery

## 2016-06-29 NOTE — Telephone Encounter (Signed)
Clearance routed to number provided via EPIC. 

## 2016-07-03 ENCOUNTER — Encounter: Payer: Self-pay | Admitting: Cardiovascular Disease

## 2016-07-03 ENCOUNTER — Ambulatory Visit (INDEPENDENT_AMBULATORY_CARE_PROVIDER_SITE_OTHER): Payer: 59 | Admitting: Cardiovascular Disease

## 2016-07-03 VITALS — BP 158/94 | HR 79 | Ht 74.0 in | Wt 204.0 lb

## 2016-07-03 DIAGNOSIS — I2510 Atherosclerotic heart disease of native coronary artery without angina pectoris: Secondary | ICD-10-CM | POA: Diagnosis not present

## 2016-07-03 DIAGNOSIS — E7800 Pure hypercholesterolemia, unspecified: Secondary | ICD-10-CM

## 2016-07-03 DIAGNOSIS — I10 Essential (primary) hypertension: Secondary | ICD-10-CM

## 2016-07-03 NOTE — Progress Notes (Signed)
07/03/2016 Cory Valentine   03-17-1955  [id number]  Primary Physician Karis Juba, PA-C Primary Cardiologist: Lorretta Harp MD Renae Gloss  HPI:  The patient is a very pleasant 61 year old thin and fit-appearing Caucasian male with no children who works at [employer]. I last saw him in the office 02/11/16.Marland Kitchen His risk factors include a 50-pack-year history of tobacco abuse, having quit at the time of his myocardial infarction January 13, 2011, when I brought him to the catheterization lab at 4 a.m. in the setting of acute anterior wall myocardial infarction. I catheterized him, demonstrating an occluded proximal LAD, which I stented using a 3 x 18 mm long Resolute drug-eluting stent. He did have some diagonal branch disease as well as 80% mid nondominant RCA stenosis. His EF at that time was 40% to 45% with moderate to severe anteroapical hypokinesia. His peak CK was 3500 with an MB of 203 and troponin greater than 25. It has been over year since his last lipid profile, and at that time he had an LDL of 140. A followup echo performed March 25, 2011, revealed an EF of greater than 55% with moderate septal hypokinesia, and Myoview stress test showed anteroapical scar without ischemia. He denies chest pain or shortness of breath. We did transition him from Effient to Plavix with a P2Y12 level of 61 (PRU). Since I saw him 6 months ago he denies chest pain or shortness of breath. He did have a 2-D echo performed 03/03/16 revealing an ejection fraction of 45-50%.   Current Outpatient Prescriptions  Medication Sig Dispense Refill  . albuterol (PROVENTIL HFA;VENTOLIN HFA) 108 (90 Base) MCG/ACT inhaler Inhale 2 puffs into the lungs every 4 (four) hours as needed for wheezing or shortness of breath. 1 Inhaler 0  . aspirin 81 MG tablet Take 81 mg by mouth daily.    Marland Kitchen atorvastatin (LIPITOR) 10 MG tablet TAKE 1 TABLET (10 MG TOTAL) BY MOUTH DAILY AT 6 PM.*PT NEEDS TO BE SEEN FOR FURTHER  REFILLS 90 tablet 1  . azithromycin (ZITHROMAX) 250 MG tablet Take 2 tablets x 1 day, then 1 tab daily for 4 days 6 tablet 0  . clopidogrel (PLAVIX) 75 MG tablet Take 1 tablet (75 mg total) by mouth daily. 90 tablet 3  . losartan (COZAAR) 100 MG tablet TAKE 1 TABLET BY MOUTH DAILY 90 tablet 1  . predniSONE (DELTASONE) 20 MG tablet Take 1 tablet (20 mg total) by mouth daily with breakfast. 5 tablet 0   No current facility-administered medications for this visit.     No Known Allergies  Social History   Social History  . Marital status: Married    Spouse name: N/A  . Number of children: N/A  . Years of education: N/A   Occupational History  . Not on file.   Social History Main Topics  . Smoking status: Former Smoker    Packs/day: 1.50    Quit date: 01/13/2011  . Smokeless tobacco: Never Used  . Alcohol use No  . Drug use: No  . Sexual activity: Not on file   Other Topics Concern  . Not on file   Social History Narrative  . No narrative on file     Review of Systems: General: negative for chills, fever, night sweats or weight changes.  Cardiovascular: negative for chest pain, dyspnea on exertion, edema, orthopnea, palpitations, paroxysmal nocturnal dyspnea or shortness of breath Dermatological: negative for rash Respiratory: negative for cough or wheezing Urologic:  negative for hematuria Abdominal: negative for nausea, vomiting, diarrhea, bright red blood per rectum, melena, or hematemesis Neurologic: negative for visual changes, syncope, or dizziness All other systems reviewed and are otherwise negative except as noted above.    Blood pressure (!) 158/94, pulse 79, height 6\' 2"  (1.88 m), weight 204 lb (92.5 kg).  General appearance: alert and no distress Neck: no adenopathy, no carotid bruit, no JVD, supple, symmetrical, trachea midline and thyroid not enlarged, symmetric, no tenderness/mass/nodules Lungs: clear to auscultation bilaterally Heart: regular rate and  rhythm, S1, S2 normal, no murmur, click, rub or gallop Extremities: extremities normal, atraumatic, no cyanosis or edema  EKG normal sinus rhythm at 79 with septal Q waves. I personally reviewed this EKG.  ASSESSMENT AND PLAN:   Coronary artery disease History of CAD status post acute anterior wall myocardial infarction 01/13/11 with stenting of proximal LAD by myself using a 3 mm x 18 mm long resolute drug-eluting stent. Did have some vital branch disease as well as an 80% mid nondominant RCA. His EF at that time was 40-45%. He denies chest pain or shortness of breath. Recent 2-D echo performed 03/03/16 revealed an EF of 45-50% with an anteroapical wall motion abnormality and grade 2 diastolic dysfunction.  Essential hypertension History of essential hypertension with blood pressure measured today at 158/94. He does have an element of "white coat hypertension. He is on losartan continue current meds at current dosing.  Hyperlipidemia History of hyperlipidemia on atorvastatin followed by his PCP      Lorretta Harp MD [facility], [facility] 07/03/2016 10:20 AM

## 2016-07-03 NOTE — Assessment & Plan Note (Signed)
History of CAD status post acute anterior wall myocardial infarction 01/13/11 with stenting of proximal LAD by myself using a 3 mm x 18 mm long resolute drug-eluting stent. Did have some vital branch disease as well as an 80% mid nondominant RCA. His EF at that time was 40-45%. He denies chest pain or shortness of breath. Recent 2-D echo performed 03/03/16 revealed an EF of 45-50% with an anteroapical wall motion abnormality and grade 2 diastolic dysfunction.

## 2016-07-03 NOTE — Assessment & Plan Note (Signed)
History of essential hypertension with blood pressure measured today at 158/94. He does have an element of "white coat hypertension. He is on losartan continue current meds at current dosing.

## 2016-07-03 NOTE — Assessment & Plan Note (Signed)
History of hyperlipidemia on atorvastatin followed by his PCP 

## 2016-07-03 NOTE — Patient Instructions (Signed)
Medication Instructions: Your physician recommends that you continue on your current medications as directed. Please refer to the Current Medication list given to you today.   Follow-Up: Your physician wants you to follow-up in: 1 year with Dr. Gwenlyn Found. You will receive a reminder letter in the mail two months in advance. If you don't receive a letter, please call our office to schedule the follow-up appointment.  You are Cleared for Laser Spine surgery.   If you need a refill on your cardiac medications before your next appointment, please call your pharmacy.

## 2016-07-07 ENCOUNTER — Telehealth: Payer: Self-pay | Admitting: Cardiovascular Disease

## 2016-07-07 NOTE — Telephone Encounter (Signed)
Documentation has been faxed. 

## 2016-07-07 NOTE — Telephone Encounter (Signed)
New Message     Request for surgical clearance:  1. What type of surgery is being performed? Not sure    2. When is this surgery scheduled? Not scheduled yet   3. Are there any medications that need to be held prior to surgery and how long? Plavix   4. Name of physician performing surgery? Laser spine surgery - Dr Antony Contras  5. What is your office phone and fax number?  Fax [phone]  Pt was seen 07/03/16 for surgical clearance and they are checking on notes and how long pt should hold plavix

## 2016-07-07 NOTE — Telephone Encounter (Signed)
New message      Calling to get an update on surgical clearance.  Please fax it to [phone] or call if you did not receive it

## 2016-07-10 ENCOUNTER — Other Ambulatory Visit: Payer: Self-pay | Admitting: Cardiovascular Disease

## 2016-07-10 NOTE — Telephone Encounter (Signed)
REFILL 

## 2016-08-01 ENCOUNTER — Other Ambulatory Visit: Payer: Self-pay | Admitting: Physician Assistant

## 2016-08-03 ENCOUNTER — Other Ambulatory Visit: Payer: 59

## 2016-10-08 ENCOUNTER — Other Ambulatory Visit: Payer: Self-pay | Admitting: Physician Assistant

## 2016-10-08 NOTE — Telephone Encounter (Signed)
Patient due for an office visit. Letter mailed  

## 2016-10-16 ENCOUNTER — Encounter: Payer: Self-pay | Admitting: Family Medicine

## 2016-10-16 ENCOUNTER — Ambulatory Visit (INDEPENDENT_AMBULATORY_CARE_PROVIDER_SITE_OTHER): Payer: 59 | Admitting: Family Medicine

## 2016-10-16 VITALS — BP 152/82 | HR 72 | Temp 98.2°F | Resp 14 | Ht 73.0 in | Wt 196.0 lb

## 2016-10-16 DIAGNOSIS — Z1159 Encounter for screening for other viral diseases: Secondary | ICD-10-CM

## 2016-10-16 DIAGNOSIS — Z0000 Encounter for general adult medical examination without abnormal findings: Secondary | ICD-10-CM | POA: Diagnosis not present

## 2016-10-16 DIAGNOSIS — I2510 Atherosclerotic heart disease of native coronary artery without angina pectoris: Secondary | ICD-10-CM | POA: Diagnosis not present

## 2016-10-16 LAB — CBC WITH DIFFERENTIAL/PLATELET
BASOS ABS: 0 {cells}/uL (ref 0–200)
Basophils Relative: 0 %
Eosinophils Absolute: 56 cells/uL (ref 15–500)
Eosinophils Relative: 1 %
HEMATOCRIT: 49.9 % (ref 38.5–50.0)
HEMOGLOBIN: 17.1 g/dL — AB (ref 13.0–17.0)
LYMPHS ABS: 2072 {cells}/uL (ref 850–3900)
Lymphocytes Relative: 37 %
MCH: 34.1 pg — ABNORMAL HIGH (ref 27.0–33.0)
MCHC: 34.3 g/dL (ref 32.0–36.0)
MCV: 99.6 fL (ref 80.0–100.0)
MONO ABS: 560 {cells}/uL (ref 200–950)
MPV: 8.6 fL (ref 7.5–12.5)
Monocytes Relative: 10 %
NEUTROS ABS: 2912 {cells}/uL (ref 1500–7800)
NEUTROS PCT: 52 %
Platelets: 213 10*3/uL (ref 140–400)
RBC: 5.01 MIL/uL (ref 4.20–5.80)
RDW: 14.5 % (ref 11.0–15.0)
WBC: 5.6 10*3/uL (ref 3.8–10.8)

## 2016-10-16 MED ORDER — METOPROLOL SUCCINATE ER 25 MG PO TB24: 25.0000 mg | ORAL_TABLET | Freq: Every day | ORAL | 3 refills | Status: DC
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
## 2016-10-17 LAB — COMPLETE METABOLIC PANEL WITH GFR
ALBUMIN: 4.8 g/dL (ref 3.6–5.1)
ALK PHOS: 77 U/L (ref 40–115)
ALT: 21 U/L (ref 9–46)
AST: 27 U/L (ref 10–35)
BILIRUBIN TOTAL: 1 mg/dL (ref 0.2–1.2)
BUN: 7 mg/dL (ref 7–25)
CO2: 26 mmol/L (ref 20–31)
CREATININE: 0.63 mg/dL — AB (ref 0.70–1.25)
Calcium: 9.5 mg/dL (ref 8.6–10.3)
Chloride: 95 mmol/L — ABNORMAL LOW (ref 98–110)
GFR, Est African American: >89 mL/min (ref >=60)
GLUCOSE: 71 mg/dL (ref 70–99)
Potassium: 4.3 mmol/L (ref 3.5–5.3)
SODIUM: 132 mmol/L — AB (ref 135–146)
TOTAL PROTEIN: 7.3 g/dL (ref 6.1–8.1)

## 2016-10-17 LAB — PSA: PSA: 1.4 ng/mL (ref <=4.0)

## 2016-10-17 LAB — HEPATITIS C ANTIBODY: HCV Ab: NONREACTIVE

## 2016-10-20 ENCOUNTER — Other Ambulatory Visit: Payer: Self-pay | Admitting: Family Medicine

## 2016-10-22 ENCOUNTER — Telehealth: Payer: Self-pay | Admitting: Family Medicine

## 2016-10-22 MED ORDER — ATORVASTATIN CALCIUM 10 MG PO TABS: ORAL_TABLET | ORAL | 1 refills | Status: DC

## 2016-10-22 NOTE — Telephone Encounter (Signed)
Pt needs refill on atorvastatin sent to [facility]

## 2016-10-22 NOTE — Telephone Encounter (Signed)
Medication called/sent to requested pharmacy  

## 2016-10-30 ENCOUNTER — Other Ambulatory Visit: Payer: Self-pay | Admitting: Physician Assistant

## 2016-10-30 NOTE — Telephone Encounter (Signed)
Refill appropriate 

## 2017-01-28 ENCOUNTER — Other Ambulatory Visit: Payer: Self-pay | Admitting: Physician Assistant

## 2017-02-01 ENCOUNTER — Other Ambulatory Visit: Payer: Self-pay | Admitting: Physician Assistant

## 2017-02-08 ENCOUNTER — Other Ambulatory Visit: Payer: Self-pay | Admitting: Physician Assistant

## 2017-03-01 ENCOUNTER — Other Ambulatory Visit: Payer: Self-pay | Admitting: Family Medicine

## 2017-03-01 MED ORDER — LOSARTAN POTASSIUM 100 MG PO TABS: 100.0000 mg | ORAL_TABLET | Freq: Every day | ORAL | 3 refills | Status: DC

## 2017-03-01 NOTE — Telephone Encounter (Signed)
[facility] requesting rx for losartan as he is transferring pharmacy. Med sent

## 2017-06-25 ENCOUNTER — Ambulatory Visit: Payer: BLUE CROSS/BLUE SHIELD | Admitting: Cardiovascular Disease

## 2017-06-25 ENCOUNTER — Encounter: Payer: Self-pay | Admitting: Cardiovascular Disease

## 2017-06-25 DIAGNOSIS — I10 Essential (primary) hypertension: Secondary | ICD-10-CM | POA: Diagnosis not present

## 2017-06-25 DIAGNOSIS — I2510 Atherosclerotic heart disease of native coronary artery without angina pectoris: Secondary | ICD-10-CM

## 2017-06-25 DIAGNOSIS — E7800 Pure hypercholesterolemia, unspecified: Secondary | ICD-10-CM | POA: Diagnosis not present

## 2017-06-25 LAB — HEPATIC FUNCTION PANEL
ALBUMIN: 4.6 g/dL (ref 3.6–4.8)
ALK PHOS: 70 IU/L (ref 39–117)
ALT: 44 IU/L (ref 0–44)
AST: 33 IU/L (ref 0–40)
Bilirubin Total: 1.1 mg/dL (ref 0.0–1.2)
Bilirubin, Direct: 0.34 mg/dL (ref 0.00–0.40)
TOTAL PROTEIN: 7 g/dL (ref 6.0–8.5)

## 2017-06-25 LAB — LIPID PANEL
CHOL/HDL RATIO: 1.9 ratio (ref 0.0–5.0)
Cholesterol, Total: 144 mg/dL (ref 100–199)
HDL: 74 mg/dL (ref >39)
LDL CALC: 62 mg/dL (ref 0–99)
Triglycerides: 39 mg/dL (ref 0–149)
VLDL Cholesterol Cal: 8 mg/dL (ref 5–40)

## 2017-06-25 NOTE — Assessment & Plan Note (Signed)
History of CAD status post acute anterior myocardial infarction 01/13/11 at 4 in the morning. Occluded proximal LAD which I stented using a 3 mm x 18 mm long resolute drug-eluting stent. He did have some vital branch disease as well as an 80% mid nondominant RCA. EF at that time was 40-45% with moderate to severe anteroapical hypokinesia. His troponin was greater than 25. 2-D echo performed 03/03/16 revealed an EF of 45-50%. He denies chest pain or shortness of breath. I did TRANSITION HIM FROM Effient to Plavix.

## 2017-06-25 NOTE — Progress Notes (Signed)
06/25/2017 Cory Valentine   12-23-1955  [id number]  Primary Physician Pickard, Cammie Mcgee, MD Primary Cardiologist: Lorretta Harp MD FACP, [place], [place], [place]  HPI:  Cory Valentine is a 62 y.o.  thin and fit-appearing Caucasian male with no children who worked at [employer] and recently retired on 03/16/17 after working there for 36 years. He he is adjusting to retirement.. I last saw him in the office 07/03/16.Marland Kitchen His risk factors include a 50-pack-year history of tobacco abuse, having quit at the time of his myocardial infarction January 13, 2011, when I brought him to the catheterization lab at 4 a.m. in the setting of acute anterior wall myocardial infarction. I catheterized him, demonstrating an occluded proximal LAD, which I stented using a 3 x 18 mm long Resolute drug-eluting stent. He did have some diagonal branch disease as well as 80% mid nondominant RCA stenosis. His EF at that time was 40% to 45% with moderate to severe anteroapical hypokinesia. His peak CK was 3500 with an MB of 203 and troponin greater than 25. It has been over year since his last lipid profile, and at that time he had an LDL of 140. A followup echo performed March 25, 2011, revealed an EF of greater than 55% with moderate septal hypokinesia, and Myoview stress test showed anteroapical scar without ischemia. He denies chest pain or shortness of breath. We did transition him from Effient to Plavix with a P2Y12 level of 61 (PRU). Since I saw him 6 months ago he denies chest pain or shortness of breath. He did have a 2-D echo performed 03/03/16 revealing an ejection fraction of 45-50%.     Current Meds  Medication Sig  . aspirin 81 MG tablet Take 81 mg by mouth daily.  Marland Kitchen atorvastatin (LIPITOR) 10 MG tablet TAKE 1 TABLET (10 MG TOTAL) BY MOUTH DAILY  . clopidogrel (PLAVIX) 75 MG tablet TAKE 1 TABLET (75 MG TOTAL) BY MOUTH DAILY.  Marland Kitchen losartan (COZAAR) 100 MG tablet Take 1 tablet (100 mg total) by mouth daily.  .  metoprolol succinate (TOPROL-XL) 25 MG 24 hr tablet Take 1 tablet (25 mg total) by mouth daily.     No Known Allergies  Social History   Socioeconomic History  . Marital status: Married    Spouse name: Not on file  . Number of children: Not on file  . Years of education: Not on file  . Highest education level: Not on file  Occupational History  . Not on file  Social Needs  . Financial resource strain: Not on file  . Food insecurity:    Worry: Not on file    Inability: Not on file  . Transportation needs:    Medical: Not on file    Non-medical: Not on file  Tobacco Use  . Smoking status: Former Smoker    Packs/day: 1.50    Last attempt to quit: 01/13/2011    Years since quitting: 6.4  . Smokeless tobacco: Never Used  Substance and Sexual Activity  . Alcohol use: No  . Drug use: No  . Sexual activity: Not on file  Lifestyle  . Physical activity:    Days per week: Not on file    Minutes per session: Not on file  . Stress: Not on file  Relationships  . Social connections:    Talks on phone: Not on file    Gets together: Not on file    Attends religious service: Not on file  Active member of club or organization: Not on file    Attends meetings of clubs or organizations: Not on file    Relationship status: Not on file  . Intimate partner violence:    Fear of current or ex partner: Not on file    Emotionally abused: Not on file    Physically abused: Not on file    Forced sexual activity: Not on file  Other Topics Concern  . Not on file  Social History Narrative  . Not on file     Review of Systems: General: negative for chills, fever, night sweats or weight changes.  Cardiovascular: negative for chest pain, dyspnea on exertion, edema, orthopnea, palpitations, paroxysmal nocturnal dyspnea or shortness of breath Dermatological: negative for rash Respiratory: negative for cough or wheezing Urologic: negative for hematuria Abdominal: negative for nausea,  vomiting, diarrhea, bright red blood per rectum, melena, or hematemesis Neurologic: negative for visual changes, syncope, or dizziness All other systems reviewed and are otherwise negative except as noted above.    Blood pressure (!) 144/84, pulse 75, height 6\' 1"  (1.854 m), weight 188 lb (85.3 kg).  General appearance: alert and no distress Neck: no adenopathy, no carotid bruit, no JVD, supple, symmetrical, trachea midline and thyroid not enlarged, symmetric, no tenderness/mass/nodules Lungs: clear to auscultation bilaterally Heart: regular rate and rhythm, S1, S2 normal, no murmur, click, rub or gallop Extremities: extremities normal, atraumatic, no cyanosis or edema Pulses: 2+ and symmetric Skin: Skin color, texture, turgor normal. No rashes or lesions Neurologic: Alert and oriented X 3, normal strength and tone. Normal symmetric reflexes. Normal coordination and gait  EKG sinus rhythm at 75 with anteroseptal old myocardial infarction. I personally reviewed this EKG.  ASSESSMENT AND PLAN:   Coronary artery disease History of CAD status post acute anterior myocardial infarction 01/13/11 at 4 in the morning. Occluded proximal LAD which I stented using a 3 mm x 18 mm long resolute drug-eluting stent. He did have some vital branch disease as well as an 80% mid nondominant RCA. EF at that time was 40-45% with moderate to severe anteroapical hypokinesia. His troponin was greater than 25. 2-D echo performed 03/03/16 revealed an EF of 45-50%. He denies chest pain or shortness of breath. I did TRANSITION HIM FROM Effient to Plavix.  Essential hypertension History of essential hypertension with blood pressure measured today at 144/84. He is on losartan and metoprolol. Continue current meds at current dosing.  Hyperlipidemia History of hyperlipidemia on statin therapy. We will recheck a lipid and liver profile      Lorretta Harp MD [facility], [facility] 06/25/2017 8:55 AM

## 2017-06-25 NOTE — Assessment & Plan Note (Signed)
History of essential hypertension with blood pressure measured today at 144/84. He is on losartan and metoprolol. Continue current meds at current dosing.

## 2017-06-25 NOTE — Patient Instructions (Signed)
Medication Instructions: Your physician recommends that you continue on your current medications as directed. Please refer to the Current Medication list given to you today.  Labwork: Your physician recommends that you return for a FASTING lipid profile and hepatic function panel today.   Follow-Up: Your physician wants you to follow-up in: 1 year with Dr. Berry. You will receive a reminder letter in the mail two months in advance. If you don't receive a letter, please call our office to schedule the follow-up appointment.  If you need a refill on your cardiac medications before your next appointment, please call your pharmacy.  

## 2017-06-25 NOTE — Assessment & Plan Note (Signed)
History of hyperlipidemia on statin therapy. We will recheck a lipid and liver profile 

## 2017-06-30 ENCOUNTER — Encounter: Payer: Self-pay | Admitting: Cardiovascular Disease

## 2017-07-11 ENCOUNTER — Other Ambulatory Visit: Payer: Self-pay | Admitting: Cardiovascular Disease

## 2017-07-12 NOTE — Telephone Encounter (Signed)
Rx sent to pharmacy   

## 2017-09-14 DIAGNOSIS — D691 Qualitative platelet defects: Secondary | ICD-10-CM | POA: Diagnosis not present

## 2017-09-14 DIAGNOSIS — F17200 Nicotine dependence, unspecified, uncomplicated: Secondary | ICD-10-CM | POA: Diagnosis not present

## 2017-09-14 DIAGNOSIS — I10 Essential (primary) hypertension: Secondary | ICD-10-CM | POA: Diagnosis not present

## 2017-09-14 DIAGNOSIS — Z7902 Long term (current) use of antithrombotics/antiplatelets: Secondary | ICD-10-CM | POA: Diagnosis not present

## 2017-09-14 DIAGNOSIS — R040 Epistaxis: Secondary | ICD-10-CM | POA: Diagnosis not present

## 2017-09-14 DIAGNOSIS — Z955 Presence of coronary angioplasty implant and graft: Secondary | ICD-10-CM | POA: Diagnosis not present

## 2017-09-14 DIAGNOSIS — Z7982 Long term (current) use of aspirin: Secondary | ICD-10-CM | POA: Diagnosis not present

## 2017-09-14 DIAGNOSIS — Z79899 Other long term (current) drug therapy: Secondary | ICD-10-CM | POA: Diagnosis not present

## 2017-09-15 ENCOUNTER — Telehealth: Payer: Self-pay | Admitting: Family Medicine

## 2017-09-15 DIAGNOSIS — R040 Epistaxis: Secondary | ICD-10-CM

## 2017-09-15 NOTE — Telephone Encounter (Signed)
Patient walked in today with c/o of a nose bleed. Patient was seen on yesterday at [facility]. He stated that he was picking his nose yesterday and his nose started bleeding and did not stop so he went to the ER. Patient states that he was discharged and told to use Afrin nasal spray to help keep nose moist. He states he does not want to use the Afrin because he has high blood pressure and he read that the Afrin causes blood pressures to rise. Patient nose is still bleeding slightly while in office and he states that it is not pouring but every now and then he has to wipe drips of blood. Advised patient that we can see him today. Patient defers because he only wants to see his PCP Dr.Pickard. I discussed with patient that we recommend that he return to the ER since declining to be seen here today due to consistent nose bleeds. Patient verbalized understanding and stated that he would go to [facility]. Patient also asked about stopping his plavix and aspirin and I advised patient that he should remain taking medications as prescribed until seen. Patient also agreed to this.

## 2017-09-15 NOTE — Telephone Encounter (Signed)
Patient was seen on yesterday 07/02 at [facility] for nose bleeds. Patient would like to know if we can refer him to Dr.Teoh and get him preferably by Friday. He had some bleeding still when he walked into our office. He called back this afternoon stating he would like to see ENT to help him remove the packing.Please advise?

## 2017-09-17 NOTE — Telephone Encounter (Signed)
Ok with ENT consult or if not we can remove packing.

## 2017-09-17 NOTE — Telephone Encounter (Signed)
Left message return call

## 2017-09-21 ENCOUNTER — Ambulatory Visit: Payer: 59 | Admitting: Family Medicine

## 2017-10-04 ENCOUNTER — Other Ambulatory Visit: Payer: Self-pay | Admitting: Family Medicine

## 2017-11-01 ENCOUNTER — Ambulatory Visit (INDEPENDENT_AMBULATORY_CARE_PROVIDER_SITE_OTHER): Payer: BLUE CROSS/BLUE SHIELD | Admitting: Family Medicine

## 2017-11-01 ENCOUNTER — Encounter: Payer: Self-pay | Admitting: Family Medicine

## 2017-11-01 VITALS — BP 184/92 | HR 90 | Temp 98.4°F | Resp 14 | Ht 73.0 in | Wt 188.0 lb

## 2017-11-01 DIAGNOSIS — I2510 Atherosclerotic heart disease of native coronary artery without angina pectoris: Secondary | ICD-10-CM

## 2017-11-01 DIAGNOSIS — L989 Disorder of the skin and subcutaneous tissue, unspecified: Secondary | ICD-10-CM

## 2017-11-01 DIAGNOSIS — Z0000 Encounter for general adult medical examination without abnormal findings: Secondary | ICD-10-CM

## 2017-11-01 DIAGNOSIS — Z87891 Personal history of nicotine dependence: Secondary | ICD-10-CM | POA: Diagnosis not present

## 2017-11-01 DIAGNOSIS — Z125 Encounter for screening for malignant neoplasm of prostate: Secondary | ICD-10-CM | POA: Diagnosis not present

## 2017-11-01 DIAGNOSIS — E7800 Pure hypercholesterolemia, unspecified: Secondary | ICD-10-CM

## 2017-11-01 DIAGNOSIS — Z122 Encounter for screening for malignant neoplasm of respiratory organs: Secondary | ICD-10-CM
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
## 2017-11-02 LAB — COMPLETE METABOLIC PANEL WITH GFR
AG Ratio: 1.8 (calc) (ref 1.0–2.5)
ALT: 64 U/L — AB (ref 9–46)
AST: 56 U/L — ABNORMAL HIGH (ref 10–35)
Albumin: 4.5 g/dL (ref 3.6–5.1)
Alkaline phosphatase (APISO): 72 U/L (ref 40–115)
BILIRUBIN TOTAL: 1.1 mg/dL (ref 0.2–1.2)
BUN/Creatinine Ratio: 12 (calc) (ref 6–22)
BUN: 8 mg/dL (ref 7–25)
CALCIUM: 9.7 mg/dL (ref 8.6–10.3)
CHLORIDE: 93 mmol/L — AB (ref 98–110)
CO2: 29 mmol/L (ref 20–32)
Creat: 0.69 mg/dL — ABNORMAL LOW (ref 0.70–1.25)
GFR, EST NON AFRICAN AMERICAN: 100 mL/min/{1.73_m2} (ref >=60)
GFR, Est African American: 116 mL/min/{1.73_m2} (ref >=60)
GLUCOSE: 100 mg/dL — AB (ref 65–99)
Globulin: 2.5 g/dL (calc) (ref 1.9–3.7)
Potassium: 4.9 mmol/L (ref 3.5–5.3)
Sodium: 132 mmol/L — ABNORMAL LOW (ref 135–146)
Total Protein: 7 g/dL (ref 6.1–8.1)

## 2017-11-02 LAB — LIPID PANEL
CHOL/HDL RATIO: 1.6 (calc) (ref <5.0)
CHOLESTEROL: 131 mg/dL (ref <200)
HDL: 80 mg/dL (ref >40)
LDL CHOLESTEROL (CALC): 40 mg/dL
Non-HDL Cholesterol (Calc): 51 mg/dL (calc) (ref <130)
Triglycerides: 39 mg/dL (ref <150)

## 2017-11-02 LAB — PSA: PSA: 2.1 ng/mL (ref <=4.0)

## 2017-11-02 LAB — EXTRA LAV TOP TUBE

## 2017-11-03 ENCOUNTER — Other Ambulatory Visit: Payer: Self-pay | Admitting: Family Medicine

## 2017-11-03 DIAGNOSIS — R945 Abnormal results of liver function studies: Principal | ICD-10-CM

## 2017-11-03 DIAGNOSIS — R7989 Other specified abnormal findings of blood chemistry: Secondary | ICD-10-CM

## 2017-11-08 ENCOUNTER — Telehealth: Payer: Self-pay | Admitting: Family Medicine

## 2017-11-08 MED ORDER — HYDROCHLOROTHIAZIDE 25 MG PO TABS: 25.0000 mg | ORAL_TABLET | Freq: Every day | ORAL | 3 refills | Status: DC

## 2017-11-08 NOTE — Telephone Encounter (Signed)
I'd add hctz 25 mg a day for htn and recheck in 1 month.

## 2017-11-08 NOTE — Telephone Encounter (Signed)
Patient called [facility] stating he has been taking the Losartan 100mg  1/2 tab bid with no change in his morning BP's at all. His PM BP's are good and stable but AM is still elevated. Please advise.

## 2017-11-08 NOTE — Telephone Encounter (Signed)
Patient aware of providers recommendations and med sent to pharm 

## 2017-11-10 ENCOUNTER — Ambulatory Visit: Payer: BLUE CROSS/BLUE SHIELD

## 2017-11-12 ENCOUNTER — Telehealth: Payer: Self-pay | Admitting: Family Medicine

## 2017-11-12 NOTE — Telephone Encounter (Signed)
Pt states that since starting the new BP med his BP has gotten really low a few times and wanted to know if he should continue it or stop it? 106/60,90/66,90/60

## 2017-11-12 NOTE — Telephone Encounter (Signed)
No that is way too low, I would stop.

## 2017-11-16 DIAGNOSIS — X32XXXD Exposure to sunlight, subsequent encounter: Secondary | ICD-10-CM | POA: Diagnosis not present

## 2017-11-16 DIAGNOSIS — L570 Actinic keratosis: Secondary | ICD-10-CM | POA: Diagnosis not present

## 2017-11-16 DIAGNOSIS — C44622 Squamous cell carcinoma of skin of right upper limb, including shoulder: Secondary | ICD-10-CM | POA: Diagnosis not present

## 2017-11-16 NOTE — Telephone Encounter (Signed)
Patient aware of providers recommendations and states that his BP stays around upper 130's / upper 80's. Informed pt that if it get 140/90 or above and stays that way to call us back.

## 2017-12-06 ENCOUNTER — Other Ambulatory Visit: Payer: Self-pay | Admitting: Cardiovascular Disease

## 2017-12-21 DIAGNOSIS — Z85828 Personal history of other malignant neoplasm of skin: Secondary | ICD-10-CM | POA: Diagnosis not present

## 2017-12-21 DIAGNOSIS — L570 Actinic keratosis: Secondary | ICD-10-CM | POA: Diagnosis not present

## 2017-12-21 DIAGNOSIS — Z08 Encounter for follow-up examination after completed treatment for malignant neoplasm: Secondary | ICD-10-CM | POA: Diagnosis not present

## 2017-12-21 DIAGNOSIS — X32XXXD Exposure to sunlight, subsequent encounter: Secondary | ICD-10-CM | POA: Diagnosis not present

## 2018-01-05 ENCOUNTER — Telehealth: Payer: Self-pay | Admitting: Cardiovascular Disease

## 2018-01-05 ENCOUNTER — Emergency Department (HOSPITAL_COMMUNITY)
Admission: EM | Admit: 2018-01-05 | Discharge: 2018-01-05 | Disposition: A | Discharge status: Home / Self Care | Payer: BLUE CROSS/BLUE SHIELD | Attending: Emergency Medicine | Admitting: Emergency Medicine

## 2018-01-05 ENCOUNTER — Other Ambulatory Visit: Payer: Self-pay

## 2018-01-05 ENCOUNTER — Encounter (HOSPITAL_COMMUNITY): Payer: Self-pay | Admitting: Emergency Medicine

## 2018-01-05 DIAGNOSIS — Z7982 Long term (current) use of aspirin: Secondary | ICD-10-CM | POA: Insufficient documentation

## 2018-01-05 DIAGNOSIS — Z955 Presence of coronary angioplasty implant and graft: Secondary | ICD-10-CM | POA: Insufficient documentation

## 2018-01-05 DIAGNOSIS — J209 Acute bronchitis, unspecified: Secondary | ICD-10-CM | POA: Diagnosis not present

## 2018-01-05 DIAGNOSIS — R040 Epistaxis: Secondary | ICD-10-CM | POA: Diagnosis not present

## 2018-01-05 DIAGNOSIS — I252 Old myocardial infarction: Secondary | ICD-10-CM | POA: Diagnosis not present

## 2018-01-05 DIAGNOSIS — J069 Acute upper respiratory infection, unspecified: Secondary | ICD-10-CM | POA: Diagnosis not present

## 2018-01-05 DIAGNOSIS — I10 Essential (primary) hypertension: Secondary | ICD-10-CM | POA: Insufficient documentation

## 2018-01-05 MED ORDER — LIDOCAINE-EPINEPHRINE-TETRACAINE (LET) SOLUTION
3.0000 mL | Freq: Once | NASAL | Status: AC
  Administered 2018-01-05: 3 mL via TOPICAL
  Filled 2018-01-05: qty 3

## 2018-01-05 NOTE — Telephone Encounter (Signed)
Left message for patient to call back  

## 2018-01-05 NOTE — ED Triage Notes (Signed)
Pt reports nosebleed for approx 1 hour. Pt states had one like this 3 months ago with packing. Pt states was starting on zpak and prednisone today for respiratory infection. Pt  today blew nose and started bleeding.

## 2018-01-05 NOTE — Discharge Instructions (Addendum)
FU with ENT in -3 days for packing removal and re-evaluation.
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
## 2018-01-05 NOTE — Telephone Encounter (Signed)
New message   Pt c/o medication issue:  1. Name of Medication: patient does not know name of medication but it is the blood thinner medication  2. How are you currently taking this medication (dosage and times per day)? 1 time daily  3. Are you having a reaction (difficulty breathing--STAT)? No   4. What is your medication issue? Patient is having frequent nose bleeds would like to know if the blood thinner medication needs to be changed. Please call to discuss.

## 2018-01-06 NOTE — Telephone Encounter (Signed)
Left voicemail for the patient to call the office back.

## 2018-01-07 NOTE — Telephone Encounter (Signed)
° °  Pt c/o medication issue:  1. Name of Medication: clopidogrel (PLAVIX) 75 MG tablet  2. How are you currently taking this medication (dosage and times per day)? TAKE 1 TABLET BY MOUTH ONCE DAILY  3. Are you having a reaction (difficulty breathing--STAT)? no  4. What is your medication issue? Patient feels med is causing nose bleeds

## 2018-01-10 DIAGNOSIS — J342 Deviated nasal septum: Secondary | ICD-10-CM | POA: Insufficient documentation

## 2018-01-10 DIAGNOSIS — R040 Epistaxis: Secondary | ICD-10-CM | POA: Diagnosis not present

## 2018-01-11 NOTE — Telephone Encounter (Signed)
Follow up    Per Leafy Ro at [facility]/Please contact patient when he needs to start back on plavix and aspirin. Per the previous message.

## 2018-01-11 NOTE — Telephone Encounter (Signed)
Attempted to contact patient to verify when he came off of his medications, no record of him stopping it. Patient did not answer. Left voicemail with call back number.,

## 2018-01-14 NOTE — Telephone Encounter (Signed)
Called patient, LVM advising to call back to discuss when he stopped his Plavix and Aspirin.  Left call back number.

## 2018-01-18 NOTE — Telephone Encounter (Signed)
Lm to call back ./cy 

## 2018-01-19 NOTE — Telephone Encounter (Signed)
Received call from patient.  He states he has had 2 severe nose bleeds in the last 3 months that have required him to go to the ER to have his nose packed.   Last episode 10/23-see ER note.   He followed up with ENT to remove packing and they just recommended he use nasal saline or saline gel.  He stated back on 1/2 tablet of Plavix on Monday 10/28 after seeing ENT, has not restarted the ASA.   He is wondering what he needs to do with his medication as this keeps reoccurring on Plavix and Aspirin.     He request this be changed or stopped as he does not want to keep having to go to the ER to have his nose packed to stop the bleeding.   Advised Dr. Gwenlyn Found is out of office, would route to DOD to review and advise on medications.

## 2018-01-19 NOTE — Telephone Encounter (Signed)
Left detailed message (ok per DPR) with recommendations.  Advised would send to Dr. Gwenlyn Found to review on return.   Advised to call with further questions or concerns.

## 2018-01-19 NOTE — Telephone Encounter (Signed)
This is not an acute issue and more a question about longterm management.  Would prefer that Dr. Gwenlyn Found decide how he would like to proceed with this patient. Recommend aspirin 81mg  only for now and then Dr. Gwenlyn Found can decide.

## 2018-01-31 ENCOUNTER — Telehealth: Payer: Self-pay | Admitting: Cardiovascular Disease

## 2018-01-31 NOTE — Telephone Encounter (Signed)
2nd attempt to reach pt. Detailed message left on pt voicemail. Pt is to call back if any questions or concerned.  Per Dr.Berry Lorretta Harp, MD  10:55 AM    Can discontinue Plavix and keep on baby aspirin

## 2018-01-31 NOTE — Telephone Encounter (Signed)
Left detailed message for the patient to stop plavix and start aspirin 81 mg once daily. He is call back with questions.

## 2018-01-31 NOTE — Telephone Encounter (Signed)
Can discontinue Plavix and keep on baby aspirin

## 2018-01-31 NOTE — Telephone Encounter (Signed)
  Patient has had a couple of nose bleeds and had called and talked to the nurse. The nurse was supposed to talk with Dr Gwenlyn Found and call the patient back but he has not heard anything. He is concerned if his medications could be causing the nose bleeds.

## 2018-02-04 ENCOUNTER — Telehealth: Payer: Self-pay | Admitting: Cardiovascular Disease

## 2018-02-04 NOTE — Telephone Encounter (Signed)
° °  Patient returning call. Please call at [phone].

## 2018-02-04 NOTE — Telephone Encounter (Signed)
Pt.notified

## 2018-02-04 NOTE — Telephone Encounter (Signed)
Attempted to contact patient regarding medication instructions from Dr.Berry. Left call back number.

## 2018-02-04 NOTE — Telephone Encounter (Signed)
Error, no note needed/kw

## 2018-02-21 DIAGNOSIS — F17210 Nicotine dependence, cigarettes, uncomplicated: Secondary | ICD-10-CM | POA: Diagnosis not present

## 2018-02-21 DIAGNOSIS — J342 Deviated nasal septum: Secondary | ICD-10-CM | POA: Diagnosis not present

## 2018-02-21 DIAGNOSIS — R040 Epistaxis: Secondary | ICD-10-CM | POA: Diagnosis not present

## 2018-02-21 DIAGNOSIS — Z7982 Long term (current) use of aspirin: Secondary | ICD-10-CM | POA: Diagnosis not present

## 2018-03-01 ENCOUNTER — Ambulatory Visit: Payer: BLUE CROSS/BLUE SHIELD | Admitting: Cardiovascular Disease

## 2018-03-01 ENCOUNTER — Encounter: Payer: Self-pay | Admitting: Cardiovascular Disease

## 2018-03-01 DIAGNOSIS — R079 Chest pain, unspecified: Secondary | ICD-10-CM | POA: Diagnosis not present

## 2018-03-01 DIAGNOSIS — I10 Essential (primary) hypertension: Secondary | ICD-10-CM | POA: Diagnosis not present

## 2018-03-01 DIAGNOSIS — E7800 Pure hypercholesterolemia, unspecified: Secondary | ICD-10-CM

## 2018-03-01 DIAGNOSIS — I2510 Atherosclerotic heart disease of native coronary artery without angina pectoris: Secondary | ICD-10-CM | POA: Diagnosis not present

## 2018-03-01 NOTE — Assessment & Plan Note (Signed)
History of hyperlipidemia on statin therapy with lipid profile performed 11/01/2017 revealing total cholesterol 131, LDL of 40 and HDL of 80.

## 2018-03-01 NOTE — Patient Instructions (Signed)
Medication Instructions:  Your physician recommends that you continue on your current medications as directed. Please refer to the Current Medication list given to you today.  If you need a refill on your cardiac medications before your next appointment, please call your pharmacy.   Lab work: NONE If you have labs (blood work) drawn today and your tests are completely normal, you will receive your results only by: Marland Kitchen MyChart Message (if you have MyChart) OR . A paper copy in the mail If you have any lab test that is abnormal or we need to change your treatment, we will call you to review the results.  Testing/Procedures: Your physician has requested that you have en exercise stress myoview. For further information please visit [email or web address]. Please follow instruction sheet, as given.   Follow-Up: At [facility], you and your health needs are our priority.  As part of our continuing mission to provide you with exceptional heart care, we have created designated Provider Care Teams.  These Care Teams include your primary Cardiologist (physician) and Advanced Practice Providers (APPs -  Physician Assistants and Nurse Practitioners) who all work together to provide you with the care you need, when you need it. You will need a follow up appointment in 12 months.  Please call our office 2 months in advance to schedule this appointment.  You may see DR. BERRY or one of the following Advanced Practice Providers on your designated Care Team:   Kerin Ransom, PA-C [place], [place] . Sande Rives, PA-C  Any Other Special Instructions Will Be Listed Below (If Applicable).

## 2018-03-01 NOTE — Progress Notes (Signed)
03/01/2018 Cory Valentine   November 14, 1955  [id number]  Primary Physician Pickard, Cammie Mcgee, MD Primary Cardiologist: Lorretta Harp MD FACP, [place], [place], [place]  HPI:  Cory Valentine is a 62 y.o.   thin and fit-appearing Caucasian male with no children who worked at [employer] and recently retired on 03/16/17 after working there for 36 years. He he is adjusting to retirement.. I last saw him in the 06/25/2017.Marland Kitchen His risk factors include a 50-pack-year history of tobacco abuse, having quit at the time of his myocardial infarction January 13, 2011, when I brought him to the catheterization lab at 4 a.m. in the setting of acute anterior wall myocardial infarction. I catheterized him, demonstrating an occluded proximal LAD, which I stented using a 3 x 18 mm long Resolute drug-eluting stent. He did have some diagonal branch disease as well as 80% mid nondominant RCA stenosis. His EF at that time was 40% to 45% with moderate to severe anteroapical hypokinesia. His peak CK was 3500 with an MB of 203 and troponin greater than 25. It has been over year since his last lipid profile, and at that time he had an LDL of 140. A followup echo performed March 25, 2011, revealed an EF of greater than 55% with moderate septal hypokinesia, and Myoview stress test showed anteroapical scar without ischemia. He denies chest pain or shortness of breath. We did transition him from Effient to Plavix with a P2Y12 level of 61 (PRU).  Since I saw him in the office 8 months ago he has developed some chest tightness with radiation to both arms occurring several times a week lasting 5 minutes at a time.   No outpatient medications have been marked as taking for the 03/01/18 encounter (Office Visit) with Lorretta Harp, MD.     No Known Allergies  Social History   Socioeconomic History  . Marital status: Single    Spouse name: Not on file  . Number of children: Not on file  . Years of education: Not on file  . Highest  education level: Not on file  Occupational History  . Not on file  Social Needs  . Financial resource strain: Not on file  . Food insecurity:    Worry: Not on file    Inability: Not on file  . Transportation needs:    Medical: Not on file    Non-medical: Not on file  Tobacco Use  . Smoking status: Former Smoker    Packs/day: 1.50    Last attempt to quit: 01/13/2011    Years since quitting: 7.1  . Smokeless tobacco: Never Used  Substance and Sexual Activity  . Alcohol use: No  . Drug use: No  . Sexual activity: Not on file  Lifestyle  . Physical activity:    Days per week: Not on file    Minutes per session: Not on file  . Stress: Not on file  Relationships  . Social connections:    Talks on phone: Not on file    Gets together: Not on file    Attends religious service: Not on file    Active member of club or organization: Not on file    Attends meetings of clubs or organizations: Not on file    Relationship status: Not on file  . Intimate partner violence:    Fear of current or ex partner: Not on file    Emotionally abused: Not on file    Physically abused: Not on  file    Forced sexual activity: Not on file  Other Topics Concern  . Not on file  Social History Narrative  . Not on file     Review of Systems: General: negative for chills, fever, night sweats or weight changes.  Cardiovascular: negative for chest pain, dyspnea on exertion, edema, orthopnea, palpitations, paroxysmal nocturnal dyspnea or shortness of breath Dermatological: negative for rash Respiratory: negative for cough or wheezing Urologic: negative for hematuria Abdominal: negative for nausea, vomiting, diarrhea, bright red blood per rectum, melena, or hematemesis Neurologic: negative for visual changes, syncope, or dizziness All other systems reviewed and are otherwise negative except as noted above.    Blood pressure (!) 144/80, pulse 87, height 6\' 2"  (1.88 m), weight 188 lb 9.6 oz (85.5 kg).    General appearance: alert and no distress Neck: no adenopathy, no carotid bruit, no JVD, supple, symmetrical, trachea midline and thyroid not enlarged, symmetric, no tenderness/mass/nodules Lungs: clear to auscultation bilaterally Heart: regular rate and rhythm, S1, S2 normal, no murmur, click, rub or gallop Extremities: extremities normal, atraumatic, no cyanosis or edema Pulses: 2+ and symmetric Skin: Skin color, texture, turgor normal. No rashes or lesions Neurologic: Alert and oriented X 3, normal strength and tone. Normal symmetric reflexes. Normal coordination and gait  EKG sinus rhythm 87 with Q waves across his precordium consistent with an old anteroseptal myocardial infarction with low limb voltage.  I personally reviewed this EKG.  ASSESSMENT AND PLAN:   Coronary artery disease History of CAD status post acute anterior STEMI 01/13/2011 with urgent cath revealing occluded proximal LAD which I stented using a 3 mm x 18 mm long resolute drug-eluting stent.  He did have some diagonal branch disease as well as an 80% mid nondominant RCA stenosis.  His EF at that time was 40 to 45% with moderate to severe inferoapical hypokinesia.  MB subfraction rose to 203 with a troponin greater than 25.  His last echo performed 03/03/2016 showed an EF of 45 to 50% with wall motion abnormalities in the LAD territory.  Over the last several months he has noticed some tightening in his chest and arms occurring several times a week lasting 5 minutes at a time.  Am going to get an exercise Myoview stress test to further evaluate.  Essential hypertension She of essential hypertension her blood pressure measured today at 144/80.  He is on hydrochlorothiazide, metoprolol and losartan.  Hyperlipidemia History of hyperlipidemia on statin therapy with lipid profile performed 11/01/2017 revealing total cholesterol 131, LDL of 40 and HDL of 80.      Lorretta Harp MD FACP,FACC,FAHA, [facility] 03/01/2018 9:25  AM

## 2018-03-01 NOTE — Assessment & Plan Note (Signed)
She of essential hypertension her blood pressure measured today at 144/80.  He is on hydrochlorothiazide, metoprolol and losartan.

## 2018-03-01 NOTE — Assessment & Plan Note (Signed)
History of CAD status post acute anterior STEMI 01/13/2011 with urgent cath revealing occluded proximal LAD which I stented using a 3 mm x 18 mm long resolute drug-eluting stent.  He did have some diagonal branch disease as well as an 80% mid nondominant RCA stenosis.  His EF at that time was 40 to 45% with moderate to severe inferoapical hypokinesia.  MB subfraction rose to 203 with a troponin greater than 25.  His last echo performed 03/03/2016 showed an EF of 45 to 50% with wall motion abnormalities in the LAD territory.  Over the last several months he has noticed some tightening in his chest and arms occurring several times a week lasting 5 minutes at a time.  Am going to get an exercise Myoview stress test to further evaluate.

## 2018-03-17 ENCOUNTER — Telehealth (HOSPITAL_COMMUNITY): Payer: Self-pay

## 2018-03-17 NOTE — Telephone Encounter (Signed)
Encounter complete. 

## 2018-03-22 ENCOUNTER — Ambulatory Visit (HOSPITAL_COMMUNITY)
Admission: RE | Admit: 2018-03-22 | Payer: BLUE CROSS/BLUE SHIELD | Source: Ambulatory Visit | Attending: Cardiovascular Disease | Admitting: Cardiovascular Disease

## 2018-04-02 ENCOUNTER — Other Ambulatory Visit: Payer: Self-pay | Admitting: Family Medicine

## 2018-07-05 ENCOUNTER — Other Ambulatory Visit: Payer: Self-pay | Admitting: Family Medicine

## 2018-09-28 ENCOUNTER — Other Ambulatory Visit: Payer: Self-pay | Admitting: Family Medicine

## 2018-12-27 ENCOUNTER — Other Ambulatory Visit: Payer: Self-pay | Admitting: Family Medicine

## 2019-02-24 ENCOUNTER — Other Ambulatory Visit: Payer: Self-pay | Admitting: Family Medicine

## 2019-05-26 ENCOUNTER — Other Ambulatory Visit: Payer: Self-pay | Admitting: Family Medicine

## 2019-07-19 ENCOUNTER — Encounter: Payer: Self-pay | Admitting: General Practice

## 2019-08-25 ENCOUNTER — Other Ambulatory Visit: Payer: Self-pay | Admitting: Family Medicine

## 2019-09-17 ENCOUNTER — Other Ambulatory Visit: Payer: Self-pay | Admitting: Family Medicine

## 2019-09-21 ENCOUNTER — Other Ambulatory Visit: Payer: Self-pay

## 2019-09-21 MED ORDER — METOPROLOL SUCCINATE ER 25 MG PO TB24: 25.0000 mg | ORAL_TABLET | Freq: Every day | ORAL | 0 refills | Status: DC

## 2019-10-24 ENCOUNTER — Ambulatory Visit (INDEPENDENT_AMBULATORY_CARE_PROVIDER_SITE_OTHER): Payer: Medicare HMO | Admitting: Family Medicine

## 2019-10-24 ENCOUNTER — Other Ambulatory Visit: Payer: Self-pay

## 2019-10-24 VITALS — BP 150/88 | HR 123 | Temp 98.0°F | Ht 74.0 in | Wt 182.0 lb

## 2019-10-24 DIAGNOSIS — Z125 Encounter for screening for malignant neoplasm of prostate: Secondary | ICD-10-CM | POA: Diagnosis not present

## 2019-10-24 DIAGNOSIS — Z136 Encounter for screening for cardiovascular disorders: Secondary | ICD-10-CM | POA: Diagnosis not present

## 2019-10-24 DIAGNOSIS — R000 Tachycardia, unspecified: Secondary | ICD-10-CM

## 2019-10-24 DIAGNOSIS — Z23 Encounter for immunization: Secondary | ICD-10-CM

## 2019-10-24 DIAGNOSIS — Z0001 Encounter for general adult medical examination with abnormal findings: Secondary | ICD-10-CM

## 2019-10-24 DIAGNOSIS — E7800 Pure hypercholesterolemia, unspecified: Secondary | ICD-10-CM

## 2019-10-24 DIAGNOSIS — Z0000 Encounter for general adult medical examination without abnormal findings: Secondary | ICD-10-CM

## 2019-10-24 DIAGNOSIS — I2510 Atherosclerotic heart disease of native coronary artery without angina pectoris: Secondary | ICD-10-CM | POA: Diagnosis not present
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
## 2019-10-25 ENCOUNTER — Other Ambulatory Visit: Payer: Self-pay

## 2019-10-25 LAB — CBC WITH DIFFERENTIAL/PLATELET
Absolute Monocytes: 455 cells/uL (ref 200–950)
Basophils Absolute: 41 cells/uL (ref 0–200)
Basophils Relative: 0.9 %
Eosinophils Absolute: 9 cells/uL — ABNORMAL LOW (ref 15–500)
Eosinophils Relative: 0.2 %
HCT: 38.5 % (ref 38.5–50.0)
Hemoglobin: 13.9 g/dL (ref 13.2–17.1)
Lymphs Abs: 966 cells/uL (ref 850–3900)
MCH: 36.8 pg — ABNORMAL HIGH (ref 27.0–33.0)
MCHC: 36.1 g/dL — ABNORMAL HIGH (ref 32.0–36.0)
MCV: 101.9 fL — ABNORMAL HIGH (ref 80.0–100.0)
MPV: 9.4 fL (ref 7.5–12.5)
Monocytes Relative: 9.9 %
Neutro Abs: 3128 cells/uL (ref 1500–7800)
Neutrophils Relative %: 68 %
Platelets: 194 10*3/uL (ref 140–400)
RBC: 3.78 10*6/uL — ABNORMAL LOW (ref 4.20–5.80)
RDW: 13.1 % (ref 11.0–15.0)
Total Lymphocyte: 21 %
WBC: 4.6 10*3/uL (ref 3.8–10.8)

## 2019-10-25 LAB — LIPID PANEL
Cholesterol: 112 mg/dL (ref <200)
HDL: 76 mg/dL (ref >=40)
LDL Cholesterol (Calc): 24 mg/dL (calc)
Non-HDL Cholesterol (Calc): 36 mg/dL (calc) (ref <130)
Total CHOL/HDL Ratio: 1.5 (calc) (ref <5.0)
Triglycerides: 42 mg/dL (ref <150)

## 2019-10-25 LAB — COMPLETE METABOLIC PANEL WITH GFR
AG Ratio: 1.8 (calc) (ref 1.0–2.5)
ALT: 32 U/L (ref 9–46)
AST: 48 U/L — ABNORMAL HIGH (ref 10–35)
Albumin: 4.6 g/dL (ref 3.6–5.1)
Alkaline phosphatase (APISO): 91 U/L (ref 35–144)
BUN/Creatinine Ratio: 6 (calc) (ref 6–22)
BUN: 5 mg/dL — ABNORMAL LOW (ref 7–25)
CO2: 27 mmol/L (ref 20–32)
Calcium: 9.7 mg/dL (ref 8.6–10.3)
Chloride: 88 mmol/L — ABNORMAL LOW (ref 98–110)
Creat: 0.82 mg/dL (ref 0.70–1.25)
GFR, Est African American: 107 mL/min/{1.73_m2} (ref >=60)
GFR, Est Non African American: 92 mL/min/{1.73_m2} (ref >=60)
Globulin: 2.5 g/dL (calc) (ref 1.9–3.7)
Glucose, Bld: 89 mg/dL (ref 65–99)
Potassium: 4.9 mmol/L (ref 3.5–5.3)
Sodium: 125 mmol/L — ABNORMAL LOW (ref 135–146)
Total Bilirubin: 1.3 mg/dL — ABNORMAL HIGH (ref 0.2–1.2)
Total Protein: 7.1 g/dL (ref 6.1–8.1)

## 2019-10-25 LAB — PSA: PSA: 2.8 ng/mL (ref <=4.0)

## 2019-10-26 ENCOUNTER — Other Ambulatory Visit: Payer: Self-pay | Admitting: *Deleted

## 2019-10-26 DIAGNOSIS — E871 Hypo-osmolality and hyponatremia: Secondary | ICD-10-CM

## 2019-10-31 ENCOUNTER — Other Ambulatory Visit: Payer: Self-pay | Admitting: Family Medicine

## 2019-11-08 ENCOUNTER — Encounter (HOSPITAL_COMMUNITY): Payer: Self-pay

## 2019-11-21 ENCOUNTER — Other Ambulatory Visit: Payer: Self-pay | Admitting: Family Medicine

## 2020-03-29 ENCOUNTER — Other Ambulatory Visit: Payer: Self-pay | Admitting: Family Medicine

## 2020-08-02 ENCOUNTER — Other Ambulatory Visit: Payer: Self-pay | Admitting: *Deleted

## 2020-08-02 MED ORDER — METOPROLOL SUCCINATE ER 25 MG PO TB24: 1.0000 | ORAL_TABLET | Freq: Every day | ORAL | 0 refills | Status: DC

## 2020-08-23 ENCOUNTER — Encounter: Payer: Self-pay | Admitting: Nurse Practitioner

## 2020-08-23 ENCOUNTER — Ambulatory Visit (INDEPENDENT_AMBULATORY_CARE_PROVIDER_SITE_OTHER): Payer: Medicare HMO | Admitting: Nurse Practitioner

## 2020-08-23 ENCOUNTER — Other Ambulatory Visit: Payer: Self-pay

## 2020-08-23 VITALS — BP 176/100 | HR 104 | Temp 98.3°F | Ht 74.0 in | Wt 188.0 lb

## 2020-08-23 DIAGNOSIS — I2510 Atherosclerotic heart disease of native coronary artery without angina pectoris: Secondary | ICD-10-CM

## 2020-08-23 DIAGNOSIS — R062 Wheezing: Secondary | ICD-10-CM | POA: Diagnosis not present

## 2020-08-23 DIAGNOSIS — I10 Essential (primary) hypertension: Secondary | ICD-10-CM | POA: Diagnosis not present

## 2020-08-23 DIAGNOSIS — E7800 Pure hypercholesterolemia, unspecified: Secondary | ICD-10-CM | POA: Diagnosis not present

## 2020-08-23 DIAGNOSIS — Z789 Other specified health status: Secondary | ICD-10-CM

## 2020-08-23 DIAGNOSIS — Z7289 Other problems related to lifestyle: Secondary | ICD-10-CM | POA: Diagnosis not present

## 2020-08-23 MED ORDER — ALBUTEROL SULFATE HFA 108 (90 BASE) MCG/ACT IN AERS: 2.0000 | INHALATION_SPRAY | Freq: Four times a day (QID) | RESPIRATORY_TRACT | 1 refills | Status: DC | PRN

## 2020-08-23 NOTE — Progress Notes (Signed)
Subjective:    Patient ID: Cory Valentine, male    DOB: 19-Dec-1955, 65 y.o.   MRN: [id number]  HPI: Cory Valentine is a 65 y.o. male presenting for follow up.  Chief Complaint  Patient presents with   Hypertension    Just took meds before arriving. Decline shingle and covid vaccines, fasting for labs   HYPERTENSION / HYPERLIPIDEMIA Currently taking losartan 100 mg daily and Toprol-XL 25 mg daily for blood pressure/coronary artery disease.  Taking atorvastatin 10 mg daily for high cholesterol. Satisfied with current treatment? yes Duration of hypertension: chronic BP monitoring frequency: weekly BP range: 140s/90s BP medication side effects: no Past BP meds: hctz, reports nose bleeds Duration of hyperlipidemia: chronic Cholesterol medication side effects: no Aspirin: no Recent stressors: no Recurrent headaches: no Visual changes: no Palpitations: no Dyspnea: no Chest pain: no Lower extremity edema: no Dizzy/lightheaded: no Water intake: drinks at least 5 bottles daily. Also reports drink 4 beers daily on average.  Not interested in cutting back at this time.  No Known Allergies  Outpatient Encounter Medications as of 08/23/2020  Medication Sig   albuterol (VENTOLIN HFA) 108 (90 Base) MCG/ACT inhaler Inhale 2 puffs into the lungs every 6 (six) hours as needed for wheezing or shortness of breath. Notify provider if using more than 4 times weekly   aspirin 81 MG tablet Take 81 mg by mouth daily.   atorvastatin (LIPITOR) 10 MG tablet TAKE 1 TABLET BY MOUTH ONCE DAILY -- PATIENT NEEDS TO CALL OFFICE TO SCHEDULE APPOINTMENT FOR FURTHER REFILLS   losartan (COZAAR) 100 MG tablet Take 1 tablet by mouth once daily   metoprolol succinate (TOPROL-XL) 25 MG 24 hr tablet Take 1 tablet (25 mg total) by mouth daily.   [DISCONTINUED] hydrochlorothiazide (HYDRODIURIL) 25 MG tablet Take 1 tablet (25 mg total) by mouth daily. (Patient not taking: Reported on 10/24/2019)   No  facility-administered encounter medications on file as of 08/23/2020.    Patient Active Problem List   Diagnosis Date Noted   Alcohol use 08/23/2020   Wheezing 08/23/2020   Low back pain 08/05/2015   Orthostatic hypotension 07/25/2014   Coronary artery disease 11/24/2012   Essential hypertension 11/24/2012   Hyperlipidemia 11/24/2012   History of tobacco abuse 11/24/2012    Past Medical History:  Diagnosis Date   High cholesterol    History of stress test    showed anteroapical scar without ischemia   Hx of echocardiogram 03/25/2011   EF >55% with moderate septal hypokinesia   Hypertension    MI (myocardial infarction) ([place]) 01/13/2011    Relevant past medical, surgical, family and social history reviewed and updated as indicated. Interim medical history since our last visit reviewed.  Review of Systems Per HPI unless specifically indicated above     Objective:    BP (!) 176/100   Pulse (!) 104   Temp 98.3 F (36.8 C)   Ht 6\' 2"  (1.88 m)   Wt 188 lb (85.3 kg)   SpO2 94%   BMI 24.14 kg/m   Wt Readings from Last 3 Encounters:  08/23/20 188 lb (85.3 kg)  10/24/19 182 lb (82.6 kg)  03/01/18 188 lb 9.6 oz (85.5 kg)    Physical Exam Vitals and nursing note reviewed.  Constitutional:      General: He is not in acute distress.    Appearance: Normal appearance. He is not toxic-appearing.  HENT:     Head: Normocephalic and atraumatic.     Right  Ear: Tympanic membrane, ear canal and external ear normal.     Left Ear: Tympanic membrane, ear canal and external ear normal.     Nose: Nose normal. No congestion or rhinorrhea.     Mouth/Throat:     Mouth: Mucous membranes are moist.     Pharynx: Oropharynx is clear. No oropharyngeal exudate.  Eyes:     General: No scleral icterus.    Extraocular Movements: Extraocular movements intact.  Cardiovascular:     Rate and Rhythm: Normal rate and regular rhythm.     Heart sounds: Normal heart sounds. No murmur  heard. Pulmonary:     Effort: Pulmonary effort is normal. No respiratory distress.     Breath sounds: Wheezing present. No rhonchi or rales.  Abdominal:     General: Abdomen is flat. Bowel sounds are normal.     Palpations: Abdomen is soft.  Musculoskeletal:     Cervical back: Normal range of motion.     Right lower leg: No edema.     Left lower leg: No edema.  Lymphadenopathy:     Cervical: No cervical adenopathy.  Skin:    General: Skin is warm and dry.     Capillary Refill: Capillary refill takes less than 2 seconds.     Coloration: Skin is not jaundiced or pale.     Findings: No erythema.  Neurological:     Mental Status: He is alert and oriented to person, place, and time.     Motor: No weakness.     Gait: Gait normal.  Psychiatric:        Mood and Affect: Mood normal.        Behavior: Behavior normal.        Thought Content: Thought content normal.        Judgment: Judgment normal.      Assessment & Plan:   Problem List Items Addressed This Visit       Cardiovascular and Mediastinum   Essential hypertension - Primary    Chronic.  Blood pressure significantly elevated today and reports blood pressure around 140/90 at home on average.  Maintained on losartan 100 mg daily and metoprolol succinate 25 mg daily.  We will check kidney function with electrolytes and liver enzymes today.  If stable, plan to increase metoprolol succinate to 50 mg daily.  Follow-up pending blood work with PCP.       Relevant Orders   Lipid panel   COMPLETE METABOLIC PANEL WITH GFR   Coronary artery disease    Chronic.  Patient is maintained on atorvastatin 10 mg, aspirin 81 mg, and metoprolol succinate 25 mg daily.  His blood pressure is significantly elevated today.  I am checking a blood count, metabolic panel with kidney function, liver enzymes, and electrolytes and we will plan to increase metoprolol to 50 mg daily if everything is stable.  I encouraged the patient to decrease alcohol  intake.  Also encouraged DASH diet.  Encouraged checking blood pressure at home and notifying us if consistently greater than 140/90.  Follow-up pending blood work-likely 3 months with PCP.       Relevant Orders   Lipid panel   COMPLETE METABOLIC PANEL WITH GFR     Other   Wheezing    InstructedGiven history of smoking, and wheezing on examination today, likely does have some chronic lung disease.  Patient agreeable to short acting bronchodilator today-we will order.  Use-use every 4-6 hours as needed for wheezing, if using more than a few  times weekly, follow-up in clinic.       Relevant Medications   albuterol (VENTOLIN HFA) 108 (90 Base) MCG/ACT inhaler   Hyperlipidemia    Chronic.  Will check lipids today-patient reports he is fasting.  Continue atorvastatin 10 mg daily for now, although consider increasing if LDL greater than 70.  Follow-up pending blood work.       Relevant Orders   Lipid panel   COMPLETE METABOLIC PANEL WITH GFR   Alcohol use    Reports drinking about 4 beers daily.  I encouraged cutting back on this.         Follow up plan: Return in about 3 months (around 11/23/2020) for follow up with PCP.

## 2020-08-23 NOTE — Assessment & Plan Note (Signed)
Reports drinking about 4 beers daily.  I encouraged cutting back on this.

## 2020-08-23 NOTE — Assessment & Plan Note (Signed)
Chronic.  Patient is maintained on atorvastatin 10 mg, aspirin 81 mg, and metoprolol succinate 25 mg daily.  His blood pressure is significantly elevated today.  I am checking a blood count, metabolic panel with kidney function, liver enzymes, and electrolytes and we will plan to increase metoprolol to 50 mg daily if everything is stable.  I encouraged the patient to decrease alcohol intake.  Also encouraged DASH diet.  Encouraged checking blood pressure at home and notifying us if consistently greater than 140/90.  Follow-up pending blood work-likely 3 months with PCP.

## 2020-08-23 NOTE — Assessment & Plan Note (Signed)
InstructedGiven history of smoking, and wheezing on examination today, likely does have some chronic lung disease.  Patient agreeable to short acting bronchodilator today-we will order.  Use-use every 4-6 hours as needed for wheezing, if using more than a few times weekly, follow-up in clinic.

## 2020-08-23 NOTE — Assessment & Plan Note (Signed)
Chronic.  Will check lipids today-patient reports he is fasting.  Continue atorvastatin 10 mg daily for now, although consider increasing if LDL greater than 70.  Follow-up pending blood work.

## 2020-08-23 NOTE — Assessment & Plan Note (Signed)
Chronic.  Blood pressure significantly elevated today and reports blood pressure around 140/90 at home on average.  Maintained on losartan 100 mg daily and metoprolol succinate 25 mg daily.  We will check kidney function with electrolytes and liver enzymes today.  If stable, plan to increase metoprolol succinate to 50 mg daily.  Follow-up pending blood work with PCP.

## 2020-08-24 ENCOUNTER — Telehealth: Payer: Self-pay | Admitting: Nurse Practitioner

## 2020-08-24 NOTE — Telephone Encounter (Signed)
Paged not received for critical lab result for unknown reason - pager never rang for on call provider.  Sodium critically low at 116.  Called patient upon being made aware of lab results to recommend management in ER - no answer and voicemail box not set up.  Current DPR does not list emergency contact. Will continue to try to reach patient regarding results.

## 2020-08-25 LAB — COMPLETE METABOLIC PANEL WITH GFR
AG Ratio: 2 (calc) (ref 1.0–2.5)
ALT: 18 U/L (ref 9–46)
AST: 38 U/L — ABNORMAL HIGH (ref 10–35)
Albumin: 4.5 g/dL (ref 3.6–5.1)
Alkaline phosphatase (APISO): 100 U/L (ref 35–144)
BUN: 14 mg/dL (ref 7–25)
CO2: 22 mmol/L (ref 20–32)
Calcium: 9.3 mg/dL (ref 8.6–10.3)
Chloride: 82 mmol/L — ABNORMAL LOW (ref 98–110)
Creat: 1.25 mg/dL (ref 0.70–1.25)
GFR, Est African American: 69 mL/min/{1.73_m2} (ref >=60)
GFR, Est Non African American: 60 mL/min/{1.73_m2} (ref >=60)
Globulin: 2.3 g/dL (calc) (ref 1.9–3.7)
Glucose, Bld: 99 mg/dL (ref 65–99)
Potassium: 5 mmol/L (ref 3.5–5.3)
Sodium: 116 mmol/L — CL (ref 135–146)
Total Bilirubin: 1.1 mg/dL (ref 0.2–1.2)
Total Protein: 6.8 g/dL (ref 6.1–8.1)

## 2020-08-25 LAB — LIPID PANEL
Cholesterol: 99 mg/dL (ref <200)
HDL: 69 mg/dL (ref >=40)
LDL Cholesterol (Calc): 17 mg/dL (calc)
Non-HDL Cholesterol (Calc): 30 mg/dL (calc) (ref <130)
Total CHOL/HDL Ratio: 1.4 (calc) (ref <5.0)
Triglycerides: 46 mg/dL (ref <150)

## 2020-08-26 ENCOUNTER — Telehealth: Payer: Self-pay

## 2020-08-26 ENCOUNTER — Telehealth: Payer: Self-pay | Admitting: *Deleted

## 2020-08-26 NOTE — Telephone Encounter (Signed)
Pt called to make appt with pcp. Pcp made aware of critical sodium lab. Form given to nurse.

## 2020-08-26 NOTE — Telephone Encounter (Signed)
-----   Message from Susy Frizzle, MD sent at 08/26/2020  7:24 AM EDT ----- I severe low sodium (116) needs to go to er to recheck to prevent seizures or coma. Or could come here and get stat level but if less than 120 needs to go to er.

## 2020-08-26 NOTE — Telephone Encounter (Signed)
Call placed to patient.   Patient made aware and states that he feels fine and does not want to pursue further treatment.   Advised that sodium levels this low are a critical value and require medical intervention. Patient reports "I'm good. The good Lord doesn't want me to live forever anyway."  Again attempted to educate on severity, but patient refused treatment or further work up.

## 2020-08-28 NOTE — Telephone Encounter (Signed)
Noted  

## 2020-09-20 ENCOUNTER — Other Ambulatory Visit: Payer: Self-pay | Admitting: Family Medicine

## 2020-10-15 DIAGNOSIS — D225 Melanocytic nevi of trunk: Secondary | ICD-10-CM | POA: Diagnosis not present

## 2020-10-15 DIAGNOSIS — I872 Venous insufficiency (chronic) (peripheral): Secondary | ICD-10-CM | POA: Diagnosis not present

## 2020-10-15 DIAGNOSIS — L853 Xerosis cutis: Secondary | ICD-10-CM | POA: Diagnosis not present

## 2020-10-15 DIAGNOSIS — L814 Other melanin hyperpigmentation: Secondary | ICD-10-CM | POA: Diagnosis not present

## 2020-10-15 DIAGNOSIS — X32XXXD Exposure to sunlight, subsequent encounter: Secondary | ICD-10-CM | POA: Diagnosis not present

## 2020-10-15 DIAGNOSIS — D485 Neoplasm of uncertain behavior of skin: Secondary | ICD-10-CM | POA: Diagnosis not present

## 2020-10-15 DIAGNOSIS — L570 Actinic keratosis: Secondary | ICD-10-CM | POA: Diagnosis not present

## 2020-10-22 ENCOUNTER — Other Ambulatory Visit: Payer: Self-pay | Admitting: *Deleted

## 2020-10-22 MED ORDER — LOSARTAN POTASSIUM 100 MG PO TABS: 100.0000 mg | ORAL_TABLET | Freq: Every day | ORAL | 1 refills | Status: DC

## 2020-10-22 MED ORDER — METOPROLOL SUCCINATE ER 25 MG PO TB24: 25.0000 mg | ORAL_TABLET | Freq: Every day | ORAL | 1 refills | Status: DC

## 2020-10-22 MED ORDER — ATORVASTATIN CALCIUM 10 MG PO TABS: ORAL_TABLET | ORAL | 1 refills | Status: DC

## 2020-10-28 ENCOUNTER — Encounter: Payer: Self-pay | Admitting: Family Medicine

## 2020-10-28 ENCOUNTER — Ambulatory Visit (INDEPENDENT_AMBULATORY_CARE_PROVIDER_SITE_OTHER): Payer: Medicare HMO | Admitting: Family Medicine

## 2020-10-28 ENCOUNTER — Other Ambulatory Visit: Payer: Self-pay

## 2020-10-28 VITALS — BP 134/80 | HR 82 | Temp 98.1°F | Resp 16 | Ht 74.0 in | Wt 179.0 lb

## 2020-10-28 DIAGNOSIS — Z122 Encounter for screening for malignant neoplasm of respiratory organs: Secondary | ICD-10-CM | POA: Diagnosis not present

## 2020-10-28 DIAGNOSIS — I10 Essential (primary) hypertension: Secondary | ICD-10-CM | POA: Diagnosis not present

## 2020-10-28 DIAGNOSIS — E871 Hypo-osmolality and hyponatremia: Secondary | ICD-10-CM | POA: Diagnosis not present

## 2020-10-28 DIAGNOSIS — F17200 Nicotine dependence, unspecified, uncomplicated: Secondary | ICD-10-CM | POA: Diagnosis not present
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
## 2020-10-29 LAB — COMPLETE METABOLIC PANEL WITH GFR
AG Ratio: 1.5 (calc) (ref 1.0–2.5)
ALT: 8 U/L — ABNORMAL LOW (ref 9–46)
AST: 18 U/L (ref 10–35)
Albumin: 4.3 g/dL (ref 3.6–5.1)
Alkaline phosphatase (APISO): 97 U/L (ref 35–144)
BUN: 11 mg/dL (ref 7–25)
CO2: 29 mmol/L (ref 20–32)
Calcium: 9.3 mg/dL (ref 8.6–10.3)
Chloride: 91 mmol/L — ABNORMAL LOW (ref 98–110)
Creat: 1.2 mg/dL (ref 0.70–1.35)
Globulin: 2.8 g/dL (calc) (ref 1.9–3.7)
Glucose, Bld: 93 mg/dL (ref 65–99)
Potassium: 5 mmol/L (ref 3.5–5.3)
Sodium: 127 mmol/L — ABNORMAL LOW (ref 135–146)
Total Bilirubin: 0.8 mg/dL (ref 0.2–1.2)
Total Protein: 7.1 g/dL (ref 6.1–8.1)
eGFR: 66 mL/min/{1.73_m2} (ref >=60)

## 2020-10-29 LAB — CORTISOL: Cortisol, Plasma: 12.2 ug/dL

## 2020-12-26 DIAGNOSIS — J069 Acute upper respiratory infection, unspecified: Secondary | ICD-10-CM | POA: Diagnosis not present

## 2020-12-26 DIAGNOSIS — J209 Acute bronchitis, unspecified: Secondary | ICD-10-CM | POA: Diagnosis not present

## 2021-01-02 ENCOUNTER — Ambulatory Visit (INDEPENDENT_AMBULATORY_CARE_PROVIDER_SITE_OTHER): Payer: Medicare HMO | Admitting: Family Medicine

## 2021-01-02 ENCOUNTER — Other Ambulatory Visit: Payer: Self-pay

## 2021-01-02 ENCOUNTER — Ambulatory Visit (HOSPITAL_COMMUNITY)
Admission: RE | Admit: 2021-01-02 | Discharge: 2021-01-02 | Disposition: A | Discharge status: Home / Self Care | Payer: No Typology Code available for payment source | Source: Ambulatory Visit | Attending: Family Medicine | Admitting: Family Medicine

## 2021-01-02 VITALS — BP 122/64 | HR 88 | Temp 99.7°F | Resp 16 | Ht 74.0 in | Wt 182.0 lb

## 2021-01-02 DIAGNOSIS — J189 Pneumonia, unspecified organism: Secondary | ICD-10-CM

## 2021-01-02 DIAGNOSIS — J439 Emphysema, unspecified: Secondary | ICD-10-CM | POA: Diagnosis not present

## 2021-01-02 MED ORDER — LEVOFLOXACIN 500 MG PO TABS: 500.0000 mg | ORAL_TABLET | Freq: Every day | ORAL | 0 refills | Status: AC

## 2021-01-02 MED ORDER — PREDNISONE 20 MG PO TABS: ORAL_TABLET | ORAL | 0 refills | Status: DC

## 2021-01-02 MED ORDER — ALBUTEROL SULFATE HFA 108 (90 BASE) MCG/ACT IN AERS: 2.0000 | INHALATION_SPRAY | Freq: Four times a day (QID) | RESPIRATORY_TRACT | 0 refills | Status: DC | PRN
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
## 2021-01-21 ENCOUNTER — Telehealth: Payer: Self-pay | Admitting: Family Medicine

## 2021-01-21 NOTE — Telephone Encounter (Signed)
Left message for patient to call back and schedule Medicare Annual Wellness Visit (AWV) in office.  ° °If not able to come in office, please offer to do virtually or by telephone.  Left office number and my jabber #[phone]. ° °Due for AWVI ° °Please schedule at anytime with Nurse Health Advisor. °  °

## 2021-01-25 ENCOUNTER — Other Ambulatory Visit: Payer: Self-pay | Admitting: Family Medicine

## 2021-01-31 ENCOUNTER — Other Ambulatory Visit: Payer: Self-pay

## 2021-01-31 ENCOUNTER — Ambulatory Visit (INDEPENDENT_AMBULATORY_CARE_PROVIDER_SITE_OTHER): Payer: Medicare HMO

## 2021-01-31 VITALS — Ht 74.0 in | Wt 188.0 lb

## 2021-01-31 DIAGNOSIS — Z0000 Encounter for general adult medical examination without abnormal findings: Secondary | ICD-10-CM | POA: Diagnosis not present

## 2021-01-31 NOTE — Patient Instructions (Signed)
Cory Valentine , Thank you for taking time to come for your Medicare Wellness Visit. I appreciate your ongoing commitment to your health goals. Please review the following plan we discussed and let me know if I can assist you in the future.   Screening recommendations/referrals: Colonoscopy: Done 01/09/2016 Repeat in 10 years  Recommended yearly ophthalmology/optometry visit for glaucoma screening and checkup Recommended yearly dental visit for hygiene and checkup  Vaccinations: Influenza vaccine: Declined Pneumococcal vaccine: 10/24/2019-2nd dose Tdap vaccine: 07/18/2015 Repeat in 10 years  Shingles vaccine: Declined.    Covid-19: Declined.  Advanced directives: Advance directive discussed with you today. Even though you declined this today, please call our office should you change your mind, and we can give you the proper paperwork for you to fill out.   Conditions/risks identified: Aim for 30 minutes of exercise or brisk walking each day, drink 6-8 glasses of water and eat lots of fruits and vegetables.   Next appointment: Follow up in one year for your annual wellness visit. 2023.  Preventive Care 38 Years and Older, Male  Preventive care refers to lifestyle choices and visits with your health care provider that can promote health and wellness. What does preventive care include? A yearly physical exam. This is also called an annual well check. Dental exams once or twice a year. Routine eye exams. Ask your health care provider how often you should have your eyes checked. Personal lifestyle choices, including: Daily care of your teeth and gums. Regular physical activity. Eating a healthy diet. Avoiding tobacco and drug use. Limiting alcohol use. Practicing safe sex. Taking low doses of aspirin every day. Taking vitamin and mineral supplements as recommended by your health care provider. What happens during an annual well check? The services and screenings done by your health  care provider during your annual well check will depend on your age, overall health, lifestyle risk factors, and family history of disease. Counseling  Your health care provider may ask you questions about your: Alcohol use. Tobacco use. Drug use. Emotional well-being. Home and relationship well-being. Sexual activity. Eating habits. History of falls. Memory and ability to understand (cognition). Work and work Statistician. Screening  You may have the following tests or measurements: Height, weight, and BMI. Blood pressure. Lipid and cholesterol levels. These may be checked every 5 years, or more frequently if you are over 84 years old. Skin check. Lung cancer screening. You may have this screening every year starting at age 10 if you have a 30-pack-year history of smoking and currently smoke or have quit within the past 15 years. Fecal occult blood test (FOBT) of the stool. You may have this test every year starting at age 60. Flexible sigmoidoscopy or colonoscopy. You may have a sigmoidoscopy every 5 years or a colonoscopy every 10 years starting at age 15. Prostate cancer screening. Recommendations will vary depending on your family history and other risks. Hepatitis C blood test. Hepatitis B blood test. Sexually transmitted disease (STD) testing. Diabetes screening. This is done by checking your blood sugar (glucose) after you have not eaten for a while (fasting). You may have this done every 1-3 years. Abdominal aortic aneurysm (AAA) screening. You may need this if you are a current or former smoker. Osteoporosis. You may be screened starting at age 31 if you are at high risk. Talk with your health care provider about your test results, treatment options, and if necessary, the need for more tests. Vaccines  Your health care provider may recommend certain  vaccines, such as: Influenza vaccine. This is recommended every year. Tetanus, diphtheria, and acellular pertussis (Tdap, Td)  vaccine. You may need a Td booster every 10 years. Zoster vaccine. You may need this after age 50. Pneumococcal 13-valent conjugate (PCV13) vaccine. One dose is recommended after age 110. Pneumococcal polysaccharide (PPSV23) vaccine. One dose is recommended after age 13. Talk to your health care provider about which screenings and vaccines you need and how often you need them. This information is not intended to replace advice given to you by your health care provider. Make sure you discuss any questions you have with your health care provider. Document Released: 03/29/2015 Document Revised: 11/20/2015 Document Reviewed: 01/01/2015 Elsevier Interactive Patient Education  2017 [employer] Prevention in the Home Falls can cause injuries. They can happen to people of all ages. There are many things you can do to make your home safe and to help prevent falls. What can I do on the outside of my home? Regularly fix the edges of walkways and driveways and fix any cracks. Remove anything that might make you trip as you walk through a door, such as a raised step or threshold. Trim any bushes or trees on the path to your home. Use bright outdoor lighting. Clear any walking paths of anything that might make someone trip, such as rocks or tools. Regularly check to see if handrails are loose or broken. Make sure that both sides of any steps have handrails. Any raised decks and porches should have guardrails on the edges. Have any leaves, snow, or ice cleared regularly. Use sand or salt on walking paths during winter. Clean up any spills in your garage right away. This includes oil or grease spills. What can I do in the bathroom? Use night lights. Install grab bars by the toilet and in the tub and shower. Do not use towel bars as grab bars. Use non-skid mats or decals in the tub or shower. If you need to sit down in the shower, use a plastic, non-slip stool. Keep the floor dry. Clean up any  water that spills on the floor as soon as it happens. Remove soap buildup in the tub or shower regularly. Attach bath mats securely with double-sided non-slip rug tape. Do not have throw rugs and other things on the floor that can make you trip. What can I do in the bedroom? Use night lights. Make sure that you have a light by your bed that is easy to reach. Do not use any sheets or blankets that are too big for your bed. They should not hang down onto the floor. Have a firm chair that has side arms. You can use this for support while you get dressed. Do not have throw rugs and other things on the floor that can make you trip. What can I do in the kitchen? Clean up any spills right away. Avoid walking on wet floors. Keep items that you use a lot in easy-to-reach places. If you need to reach something above you, use a strong step stool that has a grab bar. Keep electrical cords out of the way. Do not use floor polish or wax that makes floors slippery. If you must use wax, use non-skid floor wax. Do not have throw rugs and other things on the floor that can make you trip. What can I do with my stairs? Do not leave any items on the stairs. Make sure that there are handrails on both sides of the stairs  and use them. Fix handrails that are broken or loose. Make sure that handrails are as long as the stairways. Check any carpeting to make sure that it is firmly attached to the stairs. Fix any carpet that is loose or worn. Avoid having throw rugs at the top or bottom of the stairs. If you do have throw rugs, attach them to the floor with carpet tape. Make sure that you have a light switch at the top of the stairs and the bottom of the stairs. If you do not have them, ask someone to add them for you. What else can I do to help prevent falls? Wear shoes that: Do not have high heels. Have rubber bottoms. Are comfortable and fit you well. Are closed at the toe. Do not wear sandals. If you use a  stepladder: Make sure that it is fully opened. Do not climb a closed stepladder. Make sure that both sides of the stepladder are locked into place. Ask someone to hold it for you, if possible. Clearly mark and make sure that you can see: Any grab bars or handrails. First and last steps. Where the edge of each step is. Use tools that help you move around (mobility aids) if they are needed. These include: Canes. Walkers. Scooters. Crutches. Turn on the lights when you go into a dark area. Replace any light bulbs as soon as they burn out. Set up your furniture so you have a clear path. Avoid moving your furniture around. If any of your floors are uneven, fix them. If there are any pets around you, be aware of where they are. Review your medicines with your doctor. Some medicines can make you feel dizzy. This can increase your chance of falling. Ask your doctor what other things that you can do to help prevent falls. This information is not intended to replace advice given to you by your health care provider. Make sure you discuss any questions you have with your health care provider. Document Released: 12/27/2008 Document Revised: 08/08/2015 Document Reviewed: 04/06/2014 Elsevier Interactive Patient Education  2017 [employer].
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
## 2021-02-10 ENCOUNTER — Ambulatory Visit: Payer: Medicare HMO

## 2021-04-21 ENCOUNTER — Other Ambulatory Visit: Payer: Self-pay | Admitting: Family Medicine

## 2021-05-19 ENCOUNTER — Encounter: Payer: No Typology Code available for payment source | Admitting: Family Medicine

## 2021-06-06 ENCOUNTER — Telehealth: Payer: Self-pay

## 2021-06-06 NOTE — Telephone Encounter (Signed)
Erroneous encounter. Please disregard.

## 2021-06-16 ENCOUNTER — Other Ambulatory Visit: Payer: Self-pay

## 2021-06-16 MED ORDER — ATORVASTATIN CALCIUM 10 MG PO TABS: ORAL_TABLET | ORAL | 1 refills | Status: DC

## 2021-10-02 DIAGNOSIS — H905 Unspecified sensorineural hearing loss: Secondary | ICD-10-CM | POA: Diagnosis not present

## 2021-11-03 DIAGNOSIS — H905 Unspecified sensorineural hearing loss: Secondary | ICD-10-CM | POA: Diagnosis not present

## 2021-12-05 ENCOUNTER — Other Ambulatory Visit: Payer: Self-pay

## 2021-12-05 ENCOUNTER — Telehealth: Payer: Self-pay

## 2021-12-05 NOTE — Progress Notes (Signed)
Enter in error

## 2021-12-05 NOTE — Telephone Encounter (Signed)
Rec' fax form from [place] requesting refills for Atorvastatin cal '10mg'$  tab.   Put up front in the 'to be fax' basket to fax back.

## 2021-12-10 ENCOUNTER — Other Ambulatory Visit: Payer: Self-pay

## 2021-12-10 DIAGNOSIS — E7800 Pure hypercholesterolemia, unspecified: Secondary | ICD-10-CM

## 2021-12-10 DIAGNOSIS — I2510 Atherosclerotic heart disease of native coronary artery without angina pectoris: Secondary | ICD-10-CM

## 2021-12-10 MED ORDER — ATORVASTATIN CALCIUM 10 MG PO TABS: ORAL_TABLET | ORAL | 1 refills | Status: DC

## 2022-01-19 ENCOUNTER — Other Ambulatory Visit: Payer: Self-pay

## 2022-01-19 DIAGNOSIS — I2510 Atherosclerotic heart disease of native coronary artery without angina pectoris: Secondary | ICD-10-CM

## 2022-01-19 DIAGNOSIS — I10 Essential (primary) hypertension: Secondary | ICD-10-CM

## 2022-01-19 MED ORDER — LOSARTAN POTASSIUM 100 MG PO TABS: 100.0000 mg | ORAL_TABLET | Freq: Every day | ORAL | 0 refills | Status: DC

## 2022-01-20 ENCOUNTER — Other Ambulatory Visit: Payer: Self-pay | Admitting: Family Medicine

## 2022-01-20 DIAGNOSIS — I10 Essential (primary) hypertension: Secondary | ICD-10-CM

## 2022-01-20 DIAGNOSIS — I2510 Atherosclerotic heart disease of native coronary artery without angina pectoris: Secondary | ICD-10-CM

## 2022-02-13 ENCOUNTER — Ambulatory Visit (INDEPENDENT_AMBULATORY_CARE_PROVIDER_SITE_OTHER): Payer: No Typology Code available for payment source

## 2022-02-13 DIAGNOSIS — Z0000 Encounter for general adult medical examination without abnormal findings: Secondary | ICD-10-CM | POA: Diagnosis not present
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
## 2022-02-15 ENCOUNTER — Other Ambulatory Visit: Payer: Self-pay | Admitting: Family Medicine

## 2022-02-15 DIAGNOSIS — I2510 Atherosclerotic heart disease of native coronary artery without angina pectoris: Secondary | ICD-10-CM

## 2022-02-15 DIAGNOSIS — I10 Essential (primary) hypertension: Secondary | ICD-10-CM

## 2022-02-16 ENCOUNTER — Telehealth: Payer: Self-pay

## 2022-02-16 NOTE — Telephone Encounter (Signed)
Per pt would like to know if you would send a script for Z-pack for congestions and mucinex (his bronchitis)?  Pls advice

## 2022-02-17 ENCOUNTER — Ambulatory Visit: Payer: No Typology Code available for payment source | Admitting: Family Medicine

## 2022-02-17 ENCOUNTER — Telehealth: Payer: Self-pay

## 2022-02-17 NOTE — Telephone Encounter (Signed)
Pt stated that he has an appt(physical) on 03/06/22, just want the z-pack so he can feel better.   Pls advice

## 2022-02-17 NOTE — Telephone Encounter (Signed)
Tried to schedule appt today, however times did not work for pt, so cancel appt today and per pt will just keep his appt for 12/22.

## 2022-02-17 NOTE — Telephone Encounter (Signed)
Please call pt to make an appt w/pcp for congestion-for med Rx per pcp

## 2022-02-26 ENCOUNTER — Other Ambulatory Visit: Payer: No Typology Code available for payment source

## 2022-02-26 DIAGNOSIS — I10 Essential (primary) hypertension: Secondary | ICD-10-CM

## 2022-02-26 DIAGNOSIS — E7800 Pure hypercholesterolemia, unspecified: Secondary | ICD-10-CM

## 2022-02-26 DIAGNOSIS — Z125 Encounter for screening for malignant neoplasm of prostate: Secondary | ICD-10-CM

## 2022-02-26 DIAGNOSIS — I2510 Atherosclerotic heart disease of native coronary artery without angina pectoris: Secondary | ICD-10-CM | POA: Diagnosis not present

## 2022-02-27 LAB — CBC WITH DIFFERENTIAL/PLATELET
Absolute Monocytes: 507 cells/uL (ref 200–950)
Basophils Absolute: 51 cells/uL (ref 0–200)
Basophils Relative: 0.9 %
Eosinophils Absolute: 148 cells/uL (ref 15–500)
Eosinophils Relative: 2.6 %
HCT: 36.4 % — ABNORMAL LOW (ref 38.5–50.0)
Hemoglobin: 12.4 g/dL — ABNORMAL LOW (ref 13.2–17.1)
Lymphs Abs: 1334 cells/uL (ref 850–3900)
MCH: 32.8 pg (ref 27.0–33.0)
MCHC: 34.1 g/dL (ref 32.0–36.0)
MCV: 96.3 fL (ref 80.0–100.0)
MPV: 10 fL (ref 7.5–12.5)
Monocytes Relative: 8.9 %
Neutro Abs: 3659 cells/uL (ref 1500–7800)
Neutrophils Relative %: 64.2 %
Platelets: 236 10*3/uL (ref 140–400)
RBC: 3.78 10*6/uL — ABNORMAL LOW (ref 4.20–5.80)
RDW: 13.3 % (ref 11.0–15.0)
Total Lymphocyte: 23.4 %
WBC: 5.7 10*3/uL (ref 3.8–10.8)

## 2022-02-27 LAB — LIPID PANEL
Cholesterol: 90 mg/dL (ref <200)
HDL: 41 mg/dL (ref >=40)
LDL Cholesterol (Calc): 36 mg/dL (calc)
Non-HDL Cholesterol (Calc): 49 mg/dL (calc) (ref <130)
Total CHOL/HDL Ratio: 2.2 (calc) (ref <5.0)
Triglycerides: 45 mg/dL (ref <150)

## 2022-02-27 LAB — COMPLETE METABOLIC PANEL WITH GFR
AG Ratio: 1.2 (calc) (ref 1.0–2.5)
ALT: 10 U/L (ref 9–46)
AST: 18 U/L (ref 10–35)
Albumin: 4 g/dL (ref 3.6–5.1)
Alkaline phosphatase (APISO): 99 U/L (ref 35–144)
BUN/Creatinine Ratio: 12 (calc) (ref 6–22)
BUN: 20 mg/dL (ref 7–25)
CO2: 28 mmol/L (ref 20–32)
Calcium: 9.4 mg/dL (ref 8.6–10.3)
Chloride: 95 mmol/L — ABNORMAL LOW (ref 98–110)
Creat: 1.65 mg/dL — ABNORMAL HIGH (ref 0.70–1.35)
Globulin: 3.3 g/dL (calc) (ref 1.9–3.7)
Glucose, Bld: 102 mg/dL — ABNORMAL HIGH (ref 65–99)
Potassium: 5 mmol/L (ref 3.5–5.3)
Sodium: 132 mmol/L — ABNORMAL LOW (ref 135–146)
Total Bilirubin: 0.8 mg/dL (ref 0.2–1.2)
Total Protein: 7.3 g/dL (ref 6.1–8.1)
eGFR: 45 mL/min/{1.73_m2} — ABNORMAL LOW (ref >=60)

## 2022-02-27 LAB — PSA: PSA: 3.63 ng/mL (ref <=4.00)

## 2022-03-02 ENCOUNTER — Other Ambulatory Visit: Payer: Medicare HMO

## 2022-03-06 ENCOUNTER — Ambulatory Visit (INDEPENDENT_AMBULATORY_CARE_PROVIDER_SITE_OTHER): Payer: No Typology Code available for payment source | Admitting: Family Medicine

## 2022-03-06 ENCOUNTER — Encounter: Payer: Self-pay | Admitting: Family Medicine

## 2022-03-06 VITALS — BP 136/82 | HR 111 | Ht 74.0 in | Wt 196.8 lb

## 2022-03-06 DIAGNOSIS — I10 Essential (primary) hypertension: Secondary | ICD-10-CM

## 2022-03-06 DIAGNOSIS — R7989 Other specified abnormal findings of blood chemistry: Secondary | ICD-10-CM

## 2022-03-06 DIAGNOSIS — I2510 Atherosclerotic heart disease of native coronary artery without angina pectoris: Secondary | ICD-10-CM

## 2022-03-06 DIAGNOSIS — I499 Cardiac arrhythmia, unspecified: Secondary | ICD-10-CM

## 2022-03-06 DIAGNOSIS — I4891 Unspecified atrial fibrillation: Secondary | ICD-10-CM | POA: Diagnosis not present

## 2022-03-06 DIAGNOSIS — Z0000 Encounter for general adult medical examination without abnormal findings: Secondary | ICD-10-CM

## 2022-03-06 DIAGNOSIS — E7800 Pure hypercholesterolemia, unspecified: Secondary | ICD-10-CM | POA: Diagnosis not present

## 2022-03-06 MED ORDER — APIXABAN 5 MG PO TABS: 5.0000 mg | ORAL_TABLET | Freq: Two times a day (BID) | ORAL | 5 refills | Status: DC
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
## 2022-03-07 LAB — URINALYSIS, ROUTINE W REFLEX MICROSCOPIC
Bacteria, UA: NONE SEEN /HPF
Bilirubin Urine: NEGATIVE
Glucose, UA: NEGATIVE
Hyaline Cast: NONE SEEN /LPF
Ketones, ur: NEGATIVE
Nitrite: NEGATIVE
Specific Gravity, Urine: 1.008 (ref 1.001–1.035)
Squamous Epithelial / HPF: NONE SEEN /HPF (ref <=5)
WBC, UA: >=60 /HPF — AB (ref 0–5)
pH: 6 (ref 5.0–8.0)

## 2022-03-07 LAB — PROTEIN / CREATININE RATIO, URINE
Creatinine, Urine: 51 mg/dL (ref 20–320)
Protein/Creat Ratio: 706 mg/g creat — ABNORMAL HIGH (ref 25–148)
Protein/Creatinine Ratio: 0.706 mg/mg creat — ABNORMAL HIGH (ref 0.025–0.148)
Total Protein, Urine: 36 mg/dL — ABNORMAL HIGH (ref 5–25)

## 2022-03-10 ENCOUNTER — Other Ambulatory Visit: Payer: Self-pay

## 2022-03-10 DIAGNOSIS — N390 Urinary tract infection, site not specified: Secondary | ICD-10-CM

## 2022-03-10 MED ORDER — CEPHALEXIN 500 MG PO CAPS: 500.0000 mg | ORAL_CAPSULE | Freq: Three times a day (TID) | ORAL | 0 refills | Status: DC

## 2022-03-11 LAB — PROTEIN ELECTROPHORESIS, SERUM
Albumin ELP: 4 g/dL (ref 3.8–4.8)
Alpha 1: 0.3 g/dL (ref 0.2–0.3)
Alpha 2: 0.7 g/dL (ref 0.5–0.9)
Beta 2: 0.5 g/dL (ref 0.2–0.5)
Beta Globulin: 0.5 g/dL (ref 0.4–0.6)
Gamma Globulin: 1.4 g/dL (ref 0.8–1.7)
Total Protein: 7.4 g/dL (ref 6.1–8.1)

## 2022-03-22 ENCOUNTER — Other Ambulatory Visit: Payer: Self-pay | Admitting: Family Medicine

## 2022-03-22 DIAGNOSIS — I10 Essential (primary) hypertension: Secondary | ICD-10-CM

## 2022-03-22 DIAGNOSIS — I2510 Atherosclerotic heart disease of native coronary artery without angina pectoris: Secondary | ICD-10-CM

## 2022-03-23 NOTE — Telephone Encounter (Signed)
Requested Prescriptions  Pending Prescriptions Disp Refills   losartan (COZAAR) 100 MG tablet [Pharmacy Med Name: Losartan Potassium 100 MG Oral Tablet] 90 tablet 1    Sig: Take 1 tablet by mouth once daily     Cardiovascular:  Angiotensin Receptor Blockers Failed - 03/22/2022  7:50 AM      Failed - Cr in normal range and within 180 days    Creat  Date Value Ref Range Status  02/26/2022 1.65 (H) 0.70 - 1.35 mg/dL Final   Creatinine, Urine  Date Value Ref Range Status  03/06/2022 51 20 - 320 mg/dL Final         Failed - Valid encounter within last 6 months    Recent Outpatient Visits           1 year ago Pneumonia of left lower lobe due to infectious organism   [place] Pickard, Cammie Mcgee, MD   1 year ago Hyponatremia   [place] Pickard, Cammie Mcgee, MD   1 year ago Essential hypertension   [place] Eulogio Bear, NP   2 years ago Prostate cancer screening   [place] [facility] Schaumann, Cammie Mcgee, MD   4 years ago Prostate cancer screening   [place], Warren T, MD       Future Appointments             In 2 days [place], [place] in normal range and within 180 days    Potassium  Date Value Ref Range Status  02/26/2022 5.0 3.5 - 5.3 mmol/L Final         Passed - Patient is not pregnant      Passed - Last BP in normal range    BP Readings from Last 1 Encounters:  03/06/22 136/82

## 2022-03-25 ENCOUNTER — Ambulatory Visit
Admission: RE | Admit: 2022-03-25 | Discharge: 2022-03-25 | Disposition: A | Discharge status: Home / Self Care | Payer: No Typology Code available for payment source | Source: Ambulatory Visit | Attending: Family Medicine | Admitting: Family Medicine

## 2022-03-25 ENCOUNTER — Ambulatory Visit: Payer: No Typology Code available for payment source

## 2022-03-25 DIAGNOSIS — R7989 Other specified abnormal findings of blood chemistry: Secondary | ICD-10-CM

## 2022-03-25 DIAGNOSIS — N1330 Unspecified hydronephrosis: Secondary | ICD-10-CM | POA: Diagnosis not present

## 2022-03-25 DIAGNOSIS — N390 Urinary tract infection, site not specified: Secondary | ICD-10-CM

## 2022-03-26 ENCOUNTER — Other Ambulatory Visit: Payer: Self-pay | Admitting: Family Medicine

## 2022-03-26 DIAGNOSIS — N1330 Unspecified hydronephrosis: Secondary | ICD-10-CM

## 2022-03-26 DIAGNOSIS — R7989 Other specified abnormal findings of blood chemistry: Secondary | ICD-10-CM

## 2022-03-26 LAB — URINALYSIS, ROUTINE W REFLEX MICROSCOPIC
Bacteria, UA: NONE SEEN /HPF
Bilirubin Urine: NEGATIVE
Glucose, UA: NEGATIVE
Hgb urine dipstick: NEGATIVE
Hyaline Cast: NONE SEEN /LPF
Ketones, ur: NEGATIVE
Nitrite: NEGATIVE
RBC / HPF: NONE SEEN /HPF (ref 0–2)
Specific Gravity, Urine: 1.009 (ref 1.001–1.035)
Squamous Epithelial / HPF: NONE SEEN /HPF (ref <=5)
pH: 5.5 (ref 5.0–8.0)

## 2022-03-26 LAB — MICROSCOPIC MESSAGE

## 2022-03-28 LAB — URINE CULTURE
MICRO NUMBER:: 14413039
SPECIMEN QUALITY:: ADEQUATE

## 2022-04-07 ENCOUNTER — Other Ambulatory Visit: Payer: Self-pay

## 2022-04-07 ENCOUNTER — Inpatient Hospital Stay (HOSPITAL_COMMUNITY)
Admission: EM | Admit: 2022-04-07 | Discharge: 2022-04-11 | DRG: 292 | Disposition: A | Discharge status: Home / Self Care | Payer: No Typology Code available for payment source | Attending: Internal Medicine | Admitting: Internal Medicine

## 2022-04-07 ENCOUNTER — Emergency Department (HOSPITAL_COMMUNITY)
Admission: EM | Admit: 2022-04-07 | Discharge: 2022-04-07 | Discharge status: Against Medical Advice | Payer: No Typology Code available for payment source | Source: Home / Self Care

## 2022-04-07 ENCOUNTER — Ambulatory Visit: Payer: No Typology Code available for payment source | Attending: Internal Medicine | Admitting: Internal Medicine

## 2022-04-07 ENCOUNTER — Encounter (HOSPITAL_COMMUNITY): Payer: Self-pay

## 2022-04-07 ENCOUNTER — Emergency Department (HOSPITAL_COMMUNITY): Payer: No Typology Code available for payment source

## 2022-04-07 ENCOUNTER — Encounter (HOSPITAL_COMMUNITY): Payer: Self-pay | Admitting: Emergency Medicine

## 2022-04-07 ENCOUNTER — Encounter: Payer: Self-pay | Admitting: Internal Medicine

## 2022-04-07 VITALS — BP 146/88 | HR 92 | Ht 73.0 in | Wt 212.0 lb

## 2022-04-07 DIAGNOSIS — I4819 Other persistent atrial fibrillation: Secondary | ICD-10-CM | POA: Diagnosis present

## 2022-04-07 DIAGNOSIS — I110 Hypertensive heart disease with heart failure: Secondary | ICD-10-CM | POA: Diagnosis not present

## 2022-04-07 DIAGNOSIS — Z720 Tobacco use: Secondary | ICD-10-CM | POA: Diagnosis present

## 2022-04-07 DIAGNOSIS — E871 Hypo-osmolality and hyponatremia: Secondary | ICD-10-CM | POA: Diagnosis not present

## 2022-04-07 DIAGNOSIS — I252 Old myocardial infarction: Secondary | ICD-10-CM | POA: Diagnosis not present

## 2022-04-07 DIAGNOSIS — I10 Essential (primary) hypertension: Secondary | ICD-10-CM | POA: Diagnosis not present

## 2022-04-07 DIAGNOSIS — Z789 Other specified health status: Secondary | ICD-10-CM | POA: Diagnosis present

## 2022-04-07 DIAGNOSIS — E162 Hypoglycemia, unspecified: Secondary | ICD-10-CM

## 2022-04-07 DIAGNOSIS — N179 Acute kidney failure, unspecified: Secondary | ICD-10-CM | POA: Diagnosis present

## 2022-04-07 DIAGNOSIS — I1310 Hypertensive heart and chronic kidney disease without heart failure, with stage 1 through stage 4 chronic kidney disease, or unspecified chronic kidney disease: Secondary | ICD-10-CM | POA: Insufficient documentation

## 2022-04-07 DIAGNOSIS — I130 Hypertensive heart and chronic kidney disease with heart failure and stage 1 through stage 4 chronic kidney disease, or unspecified chronic kidney disease: Secondary | ICD-10-CM | POA: Diagnosis not present

## 2022-04-07 DIAGNOSIS — M7989 Other specified soft tissue disorders: Secondary | ICD-10-CM | POA: Insufficient documentation

## 2022-04-07 DIAGNOSIS — N289 Disorder of kidney and ureter, unspecified: Secondary | ICD-10-CM

## 2022-04-07 DIAGNOSIS — I2510 Atherosclerotic heart disease of native coronary artery without angina pectoris: Secondary | ICD-10-CM | POA: Diagnosis present

## 2022-04-07 DIAGNOSIS — I5023 Acute on chronic systolic (congestive) heart failure: Secondary | ICD-10-CM | POA: Diagnosis present

## 2022-04-07 DIAGNOSIS — I4510 Unspecified right bundle-branch block: Secondary | ICD-10-CM | POA: Diagnosis not present

## 2022-04-07 DIAGNOSIS — B9620 Unspecified Escherichia coli [E. coli] as the cause of diseases classified elsewhere: Secondary | ICD-10-CM | POA: Diagnosis not present

## 2022-04-07 DIAGNOSIS — I4891 Unspecified atrial fibrillation: Secondary | ICD-10-CM | POA: Diagnosis present

## 2022-04-07 DIAGNOSIS — E11649 Type 2 diabetes mellitus with hypoglycemia without coma: Secondary | ICD-10-CM | POA: Diagnosis not present

## 2022-04-07 DIAGNOSIS — I4821 Permanent atrial fibrillation: Secondary | ICD-10-CM | POA: Diagnosis present

## 2022-04-07 DIAGNOSIS — N189 Chronic kidney disease, unspecified: Secondary | ICD-10-CM | POA: Diagnosis present

## 2022-04-07 DIAGNOSIS — Z5321 Procedure and treatment not carried out due to patient leaving prior to being seen by health care provider: Secondary | ICD-10-CM | POA: Insufficient documentation

## 2022-04-07 DIAGNOSIS — J449 Chronic obstructive pulmonary disease, unspecified: Secondary | ICD-10-CM | POA: Diagnosis present

## 2022-04-07 DIAGNOSIS — I5020 Unspecified systolic (congestive) heart failure: Secondary | ICD-10-CM | POA: Diagnosis present

## 2022-04-07 DIAGNOSIS — N390 Urinary tract infection, site not specified: Secondary | ICD-10-CM | POA: Diagnosis not present

## 2022-04-07 DIAGNOSIS — N3000 Acute cystitis without hematuria: Secondary | ICD-10-CM | POA: Diagnosis not present

## 2022-04-07 DIAGNOSIS — Z79899 Other long term (current) drug therapy: Secondary | ICD-10-CM

## 2022-04-07 DIAGNOSIS — E7800 Pure hypercholesterolemia, unspecified: Secondary | ICD-10-CM | POA: Diagnosis present

## 2022-04-07 DIAGNOSIS — I5041 Acute combined systolic (congestive) and diastolic (congestive) heart failure: Secondary | ICD-10-CM | POA: Diagnosis not present

## 2022-04-07 DIAGNOSIS — I509 Heart failure, unspecified: Secondary | ICD-10-CM

## 2022-04-07 DIAGNOSIS — E785 Hyperlipidemia, unspecified: Secondary | ICD-10-CM | POA: Diagnosis present

## 2022-04-07 DIAGNOSIS — I5022 Chronic systolic (congestive) heart failure: Secondary | ICD-10-CM | POA: Diagnosis not present

## 2022-04-07 DIAGNOSIS — N1831 Chronic kidney disease, stage 3a: Secondary | ICD-10-CM | POA: Diagnosis present

## 2022-04-07 DIAGNOSIS — Z955 Presence of coronary angioplasty implant and graft: Secondary | ICD-10-CM | POA: Diagnosis not present

## 2022-04-07 DIAGNOSIS — R0602 Shortness of breath: Secondary | ICD-10-CM | POA: Diagnosis not present

## 2022-04-07 DIAGNOSIS — J439 Emphysema, unspecified: Secondary | ICD-10-CM | POA: Diagnosis not present

## 2022-04-07 DIAGNOSIS — Z77090 Contact with and (suspected) exposure to asbestos: Secondary | ICD-10-CM | POA: Diagnosis not present

## 2022-04-07 DIAGNOSIS — F17210 Nicotine dependence, cigarettes, uncomplicated: Secondary | ICD-10-CM | POA: Diagnosis present

## 2022-04-07 LAB — BASIC METABOLIC PANEL
Anion gap: 6 (ref 5–15)
BUN: 13 mg/dL (ref 8–23)
CO2: 30 mmol/L (ref 22–32)
Calcium: 9 mg/dL (ref 8.9–10.3)
Chloride: 91 mmol/L — ABNORMAL LOW (ref 98–111)
Creatinine, Ser: 1.42 mg/dL — ABNORMAL HIGH (ref 0.61–1.24)
GFR, Estimated: 54 mL/min — ABNORMAL LOW (ref >60)
Glucose, Bld: 62 mg/dL — ABNORMAL LOW (ref 70–99)
Potassium: 4.6 mmol/L (ref 3.5–5.1)
Sodium: 127 mmol/L — ABNORMAL LOW (ref 135–145)

## 2022-04-07 LAB — CBC WITH DIFFERENTIAL/PLATELET
Abs Immature Granulocytes: 0.03 10*3/uL (ref 0.00–0.07)
Basophils Absolute: 0.1 10*3/uL (ref 0.0–0.1)
Basophils Relative: 1 %
Eosinophils Absolute: 0 10*3/uL (ref 0.0–0.5)
Eosinophils Relative: 0 %
HCT: 36.4 % — ABNORMAL LOW (ref 39.0–52.0)
Hemoglobin: 12.5 g/dL — ABNORMAL LOW (ref 13.0–17.0)
Immature Granulocytes: 1 %
Lymphocytes Relative: 16 %
Lymphs Abs: 1 10*3/uL (ref 0.7–4.0)
MCH: 33.1 pg (ref 26.0–34.0)
MCHC: 34.3 g/dL (ref 30.0–36.0)
MCV: 96.3 fL (ref 80.0–100.0)
Monocytes Absolute: 0.8 10*3/uL (ref 0.1–1.0)
Monocytes Relative: 13 %
Neutro Abs: 4.5 10*3/uL (ref 1.7–7.7)
Neutrophils Relative %: 69 %
Platelets: 208 10*3/uL (ref 150–400)
RBC: 3.78 MIL/uL — ABNORMAL LOW (ref 4.22–5.81)
RDW: 15.3 % (ref 11.5–15.5)
WBC: 6.5 10*3/uL (ref 4.0–10.5)
nRBC: 0 % (ref 0.0–0.2)

## 2022-04-07 LAB — URINALYSIS, ROUTINE W REFLEX MICROSCOPIC
Bilirubin Urine: NEGATIVE
Glucose, UA: NEGATIVE mg/dL
Hgb urine dipstick: NEGATIVE
Ketones, ur: NEGATIVE mg/dL
Nitrite: POSITIVE — AB
Protein, ur: 100 mg/dL — AB
Specific Gravity, Urine: 1.011 (ref 1.005–1.030)
WBC, UA: >50 WBC/hpf — ABNORMAL HIGH (ref 0–5)
pH: 6 (ref 5.0–8.0)

## 2022-04-07 LAB — BRAIN NATRIURETIC PEPTIDE: B Natriuretic Peptide: 845.2 pg/mL — ABNORMAL HIGH (ref 0.0–100.0)

## 2022-04-07 LAB — SODIUM, URINE, RANDOM: Sodium, Ur: 75 mmol/L

## 2022-04-07 LAB — CBG MONITORING, ED: Glucose-Capillary: 123 mg/dL — ABNORMAL HIGH (ref 70–99)

## 2022-04-07 MED ORDER — IPRATROPIUM-ALBUTEROL 0.5-2.5 (3) MG/3ML IN SOLN: 3.0000 mL | Freq: Four times a day (QID) | RESPIRATORY_TRACT | Status: DC | PRN

## 2022-04-07 MED ORDER — ATORVASTATIN CALCIUM 10 MG PO TABS
10.0000 mg | ORAL_TABLET | Freq: Every day | ORAL | Status: DC
  Administered 2022-04-08 – 2022-04-11 (×4): 10 mg via ORAL
  Filled 2022-04-07 (×4): qty 1

## 2022-04-07 MED ORDER — IPRATROPIUM-ALBUTEROL 0.5-2.5 (3) MG/3ML IN SOLN
3.0000 mL | Freq: Once | RESPIRATORY_TRACT | Status: AC
  Administered 2022-04-07: 3 mL via RESPIRATORY_TRACT
  Filled 2022-04-07: qty 3

## 2022-04-07 MED ORDER — ONDANSETRON HCL 4 MG/2ML IJ SOLN: 4.0000 mg | Freq: Four times a day (QID) | INTRAMUSCULAR | Status: DC | PRN

## 2022-04-07 MED ORDER — METOPROLOL SUCCINATE ER 25 MG PO TB24
25.0000 mg | ORAL_TABLET | Freq: Every day | ORAL | Status: DC
  Administered 2022-04-08 – 2022-04-11 (×4): 25 mg via ORAL
  Filled 2022-04-07 (×4): qty 1

## 2022-04-07 MED ORDER — ENOXAPARIN SODIUM 40 MG/0.4ML IJ SOSY
40.0000 mg | PREFILLED_SYRINGE | INTRAMUSCULAR | Status: DC
  Administered 2022-04-07 – 2022-04-08 (×2): 40 mg via SUBCUTANEOUS
  Filled 2022-04-07 (×2): qty 0.4

## 2022-04-07 MED ORDER — ACETAMINOPHEN 650 MG RE SUPP: 650.0000 mg | Freq: Four times a day (QID) | RECTAL | Status: DC | PRN

## 2022-04-07 MED ORDER — FUROSEMIDE 10 MG/ML IJ SOLN
40.0000 mg | Freq: Two times a day (BID) | INTRAMUSCULAR | Status: DC
  Administered 2022-04-08: 40 mg via INTRAVENOUS
  Filled 2022-04-07: qty 4

## 2022-04-07 MED ORDER — FUROSEMIDE 10 MG/ML IJ SOLN
40.0000 mg | Freq: Once | INTRAMUSCULAR | Status: AC
  Administered 2022-04-07: 40 mg via INTRAVENOUS
  Filled 2022-04-07: qty 4

## 2022-04-07 MED ORDER — ASPIRIN 81 MG PO TBEC
81.0000 mg | DELAYED_RELEASE_TABLET | Freq: Every day | ORAL | Status: DC
  Administered 2022-04-09: 81 mg via ORAL
  Filled 2022-04-07 (×2): qty 1

## 2022-04-07 MED ORDER — IPRATROPIUM-ALBUTEROL 0.5-2.5 (3) MG/3ML IN SOLN
3.0000 mL | Freq: Four times a day (QID) | RESPIRATORY_TRACT | Status: DC | PRN
  Administered 2022-04-09 – 2022-04-10 (×2): 3 mL via RESPIRATORY_TRACT
  Filled 2022-04-07 (×2): qty 3

## 2022-04-07 MED ORDER — POTASSIUM CHLORIDE CRYS ER 10 MEQ PO TBCR
10.0000 meq | EXTENDED_RELEASE_TABLET | Freq: Every day | ORAL | Status: DC
  Administered 2022-04-08: 10 meq via ORAL
  Filled 2022-04-07: qty 1

## 2022-04-07 MED ORDER — ALBUTEROL SULFATE (2.5 MG/3ML) 0.083% IN NEBU: 2.5000 mg | INHALATION_SOLUTION | RESPIRATORY_TRACT | Status: DC | PRN

## 2022-04-07 MED ORDER — ONDANSETRON HCL 4 MG PO TABS: 4.0000 mg | ORAL_TABLET | Freq: Four times a day (QID) | ORAL | Status: DC | PRN

## 2022-04-07 MED ORDER — ACETAMINOPHEN 325 MG PO TABS: 650.0000 mg | ORAL_TABLET | Freq: Four times a day (QID) | ORAL | Status: DC | PRN

## 2022-04-07 MED ORDER — SENNOSIDES-DOCUSATE SODIUM 8.6-50 MG PO TABS: 1.0000 | ORAL_TABLET | Freq: Every evening | ORAL | Status: DC | PRN

## 2022-04-07 NOTE — Assessment & Plan Note (Signed)
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  He says he is seeing a pulmonologist in [place].  Not utilizing inhalers at home.  Does have diffuse wheezing on exam. -DuoNeb treatment given, continue prn

## 2022-04-07 NOTE — Assessment & Plan Note (Addendum)
Hyponatremia, hypomagnesemia. CKD stage 3a.   Renal function has stabilized with a serum cr at the time of his discharge of 1,74 with K at 3,8 and serum bicarbonate at 30. Na 131 and Mg 1,8    Plan to resume diuretic therapy on 01/28 and follow up renal function next week.  Increase furosemide to 40 mg daily in case of volume overload.

## 2022-04-07 NOTE — Assessment & Plan Note (Signed)
Reports drinking about 4 beers daily.  No sign of withdrawal.  Continue to monitor.

## 2022-04-07 NOTE — Hospital Course (Addendum)
Cory Valentine was admitted to the hospital with the working diagnosis of heart failure exacerbation.   67 y.o. male with medical history significant for CAD/STEMI 2012 s/p DES pLAD (residual 80% mid RCA stenosis), HFmrEF, HTN, HLD, chronic hyponatremia, EtOH use, tobacco use, recently diagnosed atrial fibrillation not on anticoagulation per patient preference who is admitted with acute on chronic CHF exacerbation.  Reported progressive edema at his lower extremities for the last 6 months, along with dyspnea on exertion, PND and orthopnea. On his initial physical examination his blood pressure was 171/114, HR 93, RR 18 and 02 saturation 98%, lungs with expiratory wheezing, heart with S1 and S2 irregularly irregular, with no murmurs, abdomen with no distention, positive 3 + lower extremity edema up to the thighs.   Na 127, K 4.6 CL 91, bicarbonate 30, glucose 62 bun 13 cr 1,42.  BNP 845.2  Wbc 6,5 hgb 12.5 plt 208  Urine analysis with SG 1.011, 100 protein, large leukocytes, urine osmolality 385, urinary NA 75   Chest radiograph with cardiomegaly, with bilateral hilar vascular congestion, right base atelectasis and small bilateral pleural effusions. Sternotomy wires in place.  EKG 100 bpm, right axis, right bundle branch block, normal qtc, atrial fibrillation rhythm with poor R R wave progression, with no significant ST segment or T wave changes.   Patient was placed on IV furosemide for diuresis, negative fluid balance was achieved with good toleration.  Patient was placed on anticoagulation for atrial fibrillation.  Urine culture positive for E coli resistant to cephalexin.   Plan to follow up as outpatient renal function next week. Continue diuresis with furosemide.
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
## 2022-04-07 NOTE — Assessment & Plan Note (Signed)
Smoking 3-4 cigarettes/day.  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

## 2022-04-07 NOTE — Assessment & Plan Note (Addendum)
Echocardiogram with septal and apical hypokinesis. LV mild reduced 40 to 45%, RV with mild reduction in systolic function., LA and RA with moderate dilatation. Mild to moderate TR.   Urine output is 3729 ml Systolic blood pressure is 120 to 130 mmHg.   Holding on RAS inhibition until renal function more stable. No SGLT 2 inh due to recent urinary tract infection and pyuria.  Continue metoprolol succinate.

## 2022-04-07 NOTE — ED Triage Notes (Signed)
Pt reports feet and leg swelling x 6-8 months, saw PCP in December and was referred to cardiology which he saw this morning and was told to come to the ER.

## 2022-04-07 NOTE — ED Provider Notes (Signed)
[facility] Provider Note   CSN: [id number] Arrival date & time: 04/07/22  1551     History  Chief Complaint  Patient presents with   Leg Swelling    Cory Valentine is a 67 y.o. male with a past medical history of STEMI 2012, hypertension, hyperlipidemia and permanent A-fib presenting today from cardiology due to fluid overload.  He tells me that for the past 6 months he has been having progressive swelling in the bilateral lower extremities.  Also says that he is dyspneic on exertion, even when he only walks around 30 feet.  Denies any orthopnea.  No chest pain or palpitations.  HPI     Home Medications Prior to Admission medications   Medication Sig Start Date End Date Taking? Authorizing Provider  atorvastatin (LIPITOR) 10 MG tablet TAKE 1 TABLET BY MOUTH ONCE DAILY 12/10/21   Susy Frizzle, MD  cephALEXin (KEFLEX) 500 MG capsule Take 1 capsule (500 mg total) by mouth 3 (three) times daily. 03/10/22   Susy Frizzle, MD  losartan (COZAAR) 100 MG tablet Take 1 tablet by mouth once daily 03/23/22   Susy Frizzle, MD  metoprolol succinate (TOPROL-XL) 25 MG 24 hr tablet Take 1 tablet by mouth once daily 04/21/21   Susy Frizzle, MD      Allergies    Patient has no known allergies.    Review of Systems   Review of Systems  Physical Exam Updated Vital Signs BP (!) 171/114 (BP Location: Right Arm)   Pulse 93   Temp 98.5 F (36.9 C)   Resp 18   Ht '6\' 1"'$  (1.854 m)   Wt 96.2 kg   SpO2 95%   BMI 27.97 kg/m  Physical Exam Vitals and nursing note reviewed.  Constitutional:      Appearance: Normal appearance.  HENT:     Head: Normocephalic and atraumatic.  Eyes:     General: No scleral icterus.    Conjunctiva/sclera: Conjunctivae normal.  Cardiovascular:     Rate and Rhythm: Normal rate. Rhythm irregular.  Pulmonary:     Effort: Pulmonary effort is normal. No respiratory distress.     Breath sounds: Wheezing  present.  Musculoskeletal:     Right lower leg: Edema present.     Left lower leg: Edema present.     Comments: 3+ pitting edema in bilateral lower extremities to the level of the knee  Skin:    Findings: No rash.  Neurological:     Mental Status: He is alert.  Psychiatric:        Mood and Affect: Mood normal.     ED Results / Procedures / Treatments   Labs (all labs ordered are listed, but only abnormal results are displayed) Labs Reviewed  BASIC METABOLIC PANEL - Abnormal; Notable for the following components:      Result Value   Sodium 127 (*)    Chloride 91 (*)    Glucose, Bld 62 (*)    Creatinine, Ser 1.42 (*)    GFR, Estimated 54 (*)    All other components within normal limits  CBC WITH DIFFERENTIAL/PLATELET - Abnormal; Notable for the following components:   RBC 3.78 (*)    Hemoglobin 12.5 (*)    HCT 36.4 (*)    All other components within normal limits  BRAIN NATRIURETIC PEPTIDE - Abnormal; Notable for the following components:   B Natriuretic Peptide 845.2 (*)    All other components within  normal limits    EKG None  Radiology DG Chest 2 View  Result Date: 04/07/2022 CLINICAL DATA:  Shortness of breath EXAM: CHEST - 2 VIEW COMPARISON:  01/02/2021, 01/15/2011 FINDINGS: Mild bronchitic changes. Similar pleural and parenchymal scarring at the right base. Post sternotomy changes. Borderline cardiomegaly. No pneumothorax IMPRESSION: No active cardiopulmonary disease. Chronic changes at the right base. Electronically Signed   By: Donavan Foil M.D.   On: 04/07/2022 17:35    Procedures Procedures   Medications Ordered in ED Medications - No data to display  ED Course/ Medical Decision Making/ A&P                             Medical Decision Making Risk Prescription drug management. Decision regarding hospitalization.   67 year old male who presents today at the request of cardiology for fluid overload.    Past Medical History / Co-morbidities / Social  History: HTN, HLD, Afib   Additional history: Reviewed patient's outpatient and with cardiology.  They requested admission for cardiology consult for IV diuresis.  It appears they plan to do an echocardiogram during his admission  Echocardiogram 2017 with EF of 45 to 50%   Physical Exam: Pertinent physical exam findings include Bilateral lower extremity pitting edema Scattered wheezing  Lab Tests: I ordered, and personally interpreted labs.  The pertinent results include: BNP 845.2 Na 127 (reports cutting down on drinking.  Used to drink 4-5 drinks a day)   Imaging Studies: I ordered and independently visualized and interpreted chest x-ray and I agree with the radiologist that no acute findings such as pulmonary edema   Cardiac Monitoring:  The patient was maintained on a cardiac monitor.  I viewed and interpreted the cardiac monitored which showed an underlying rhythm of: A-fib with a steady rate   Medications: 40 of Lasix and DuoNeb   MDM/Disposition: This is a 67 year old male with a history of hypertension, hyperlipidemia, A-fib and heart failure recently diagnosed by cardiology presenting today at the request due to fluid overload.  It appears that they were hoping for the patient to be admitted.  On my exam he is fluid overloaded.  X-ray without signs of severe pulmonary edema.  He is wheezing as well.  He was given DuoNeb as well as 40 of Lasix.  Will require admission to the hospital for further diuresis and management of his hyponatremia.    Final Clinical Impression(s) / ED Diagnoses Final diagnoses:  Hyponatremia  Acute on chronic congestive heart failure, unspecified heart failure type [facility])    Rx / DC Orders ED Discharge Orders     None      Admit to Dr. Delle Reining, Peck, PA-C 04/07/22 2043    Godfrey Pick, MD 04/08/22 [phone]

## 2022-04-07 NOTE — Assessment & Plan Note (Addendum)
Continue blood pressure control with metoprolol.  

## 2022-04-07 NOTE — Assessment & Plan Note (Signed)
Continue atorvastatin

## 2022-04-07 NOTE — ED Provider Triage Note (Signed)
Emergency Medicine Provider Triage Evaluation Note  Cory Valentine , Valentine 67 y.o. male  was evaluated in triage.  Pt complains of patient notes that they have had bilateral leg swelling x 4-6 months.  Has associated shortness of breath that is exacerbated with exertion.  Was told that he has CHF, but no medications at this time.  Denies chest pain.  Review of Systems  Positive:  Negative:   Physical Exam  BP (!) 171/114 (BP Location: Right Arm)   Pulse 93   Temp 98.5 F (36.9 C)   Resp 18   Ht '6\' 1"'$  (1.854 m)   Wt 96.2 kg   SpO2 95%   BMI 27.97 kg/m  Gen:   Awake, no distress   Resp:  Normal effort  MSK:   Moves extremities without difficulty  Other:  1+ pitting edema noted to BLE without TTP.   Medical Decision Making  Medically screening exam initiated at 4:53 PM.  Appropriate orders placed.  Cory Valentine was informed that the remainder of the evaluation will be completed by another provider, this initial triage assessment does not replace that evaluation, and the importance of remaining in the ED until their evaluation is complete.  Work-up initiated.    Cory Conaway A, PA-C 04/07/22 1659

## 2022-04-07 NOTE — ED Triage Notes (Signed)
Pt was sent in from cardiologist for bilateral leg swelling in legs for 4-6 months. Endorses shortness of breath that worsens with activity. Denies chest pain. Hx of chf.

## 2022-04-07 NOTE — Patient Instructions (Addendum)
Medication Instructions:  Your physician recommends that you continue on your current medications as directed. Please refer to the Current Medication list given to you today.  Labwork: none  Testing/Procedures: none  Follow-Up: Your physician recommends that you schedule a follow-up appointment in: after ED visit  Any Other Special Instructions Will Be Listed Below (If Applicable).  If you need a refill on your cardiac medications before your next appointment, please call your pharmacy.

## 2022-04-07 NOTE — Assessment & Plan Note (Deleted)
Review of labs suggests chronic hyponatremia.  Sodium 127 this admission.  Could be multifactorial related to hypervolemia, alcohol use, SIADH. -Continue diuresis as above -Check urine and serum osmolality, urine sodium -Repeat labs in a.m.

## 2022-04-07 NOTE — Assessment & Plan Note (Addendum)
Hx STEMI 2012 s/p DES pLAD with residual 80% mid RCA stenosis Denies any chest pain. -Continue Toprol-XL and atorvastatin

## 2022-04-07 NOTE — Assessment & Plan Note (Signed)
New diagnosis 1 month ago.  Remains in atrial fibrillation with controlled rate.  Patient has elected against anticoagulation due to history of epistaxis.  CHA2DS2-VASc score is at least 4 suggesting a 4.8% yearly stroke risk.  Risk versus benefits discussed with patient.  Recommended he discuss further with his cardiology team. -Continue Toprol-XL -Continue discussions regarding anticoagulation
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
## 2022-04-08 ENCOUNTER — Inpatient Hospital Stay (HOSPITAL_COMMUNITY): Payer: No Typology Code available for payment source

## 2022-04-08 DIAGNOSIS — I4819 Other persistent atrial fibrillation: Secondary | ICD-10-CM

## 2022-04-08 DIAGNOSIS — N179 Acute kidney failure, unspecified: Secondary | ICD-10-CM

## 2022-04-08 DIAGNOSIS — I10 Essential (primary) hypertension: Secondary | ICD-10-CM

## 2022-04-08 DIAGNOSIS — I5022 Chronic systolic (congestive) heart failure: Secondary | ICD-10-CM

## 2022-04-08 LAB — CBC
HCT: 32.7 % — ABNORMAL LOW (ref 39.0–52.0)
Hemoglobin: 11.7 g/dL — ABNORMAL LOW (ref 13.0–17.0)
MCH: 33.5 pg (ref 26.0–34.0)
MCHC: 35.8 g/dL (ref 30.0–36.0)
MCV: 93.7 fL (ref 80.0–100.0)
Platelets: 179 10*3/uL (ref 150–400)
RBC: 3.49 MIL/uL — ABNORMAL LOW (ref 4.22–5.81)
RDW: 15.3 % (ref 11.5–15.5)
WBC: 6 10*3/uL (ref 4.0–10.5)
nRBC: 0 % (ref 0.0–0.2)

## 2022-04-08 LAB — ECHOCARDIOGRAM COMPLETE
AR max vel: 2.52 cm2
AV Area VTI: 2.29 cm2
AV Area mean vel: 2.38 cm2
AV Mean grad: 3 mmHg
AV Peak grad: 5.9 mmHg
Ao pk vel: 1.21 m/s
Height: 73 in
S' Lateral: 2.7 cm
Weight: 3392 oz

## 2022-04-08 LAB — BASIC METABOLIC PANEL
Anion gap: 11 (ref 5–15)
BUN: 16 mg/dL (ref 8–23)
CO2: 24 mmol/L (ref 22–32)
Calcium: 8.8 mg/dL — ABNORMAL LOW (ref 8.9–10.3)
Chloride: 92 mmol/L — ABNORMAL LOW (ref 98–111)
Creatinine, Ser: 1.57 mg/dL — ABNORMAL HIGH (ref 0.61–1.24)
GFR, Estimated: 48 mL/min — ABNORMAL LOW (ref >60)
Glucose, Bld: 104 mg/dL — ABNORMAL HIGH (ref 70–99)
Potassium: 4.5 mmol/L (ref 3.5–5.1)
Sodium: 127 mmol/L — ABNORMAL LOW (ref 135–145)

## 2022-04-08 LAB — HIV ANTIBODY (ROUTINE TESTING W REFLEX): HIV Screen 4th Generation wRfx: NONREACTIVE

## 2022-04-08 LAB — OSMOLALITY, URINE: Osmolality, Ur: 385 mOsm/kg (ref 300–900)

## 2022-04-08 MED ORDER — FUROSEMIDE 10 MG/ML IJ SOLN
60.0000 mg | Freq: Two times a day (BID) | INTRAMUSCULAR | Status: DC
  Administered 2022-04-08 – 2022-04-09 (×3): 60 mg via INTRAVENOUS
  Filled 2022-04-08 (×3): qty 6
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
## 2022-04-08 NOTE — Progress Notes (Signed)
Progress Note   Patient: Cory Valentine NLZ:[id number] DOB: 07/06/1955 DOA: 04/07/2022     1 DOS: the patient was seen and examined on 04/08/2022   Brief hospital course: KASHIS PENLEY is a 67 y.o. male with medical history significant for CAD/STEMI 2012 s/p DES pLAD (residual 80% mid RCA stenosis), HFmrEF, HTN, HLD, chronic hyponatremia, EtOH use, tobacco use, recently diagnosed atrial fibrillation not on anticoagulation per patient preference who is admitted with acute on chronic CHF exacerbation.  Reported progressive edema at his lower extremities for the last 6 months, along with dyspnea on exertion, PND and orthopnea. On his initial physical examination his blood pressure was 171/114, HR 93, RR 18 and 02 saturation 98%, lungs with expiratory wheezing, heart with S1 and S2 irregularly irregular, with no murmurs, abdomen with no distention, positive 3 + lower extremity edema up to the thighs.   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 LV mild reduced 40 to 45%, RV with mild reduction in systolic function., LA and RA with moderate dilatation. Mild to moderate TR.   Continue to have edema. Plan to continue diuresis with IV furosemide, close monitoring blood pressure and renal function.   AKI (acute kidney injury) ([place]) Hyponatremia   Renal function with serum cr at 1,7 with K at 4,5 and serum bicarbonate at 24. Na 127.  Plan to continue diuresis for hypervolemia, and follow up renal function in am, avoid hypotension and nephrotoxic medications.   Persistent atrial fibrillation (HCC) Continue rate control with metoprolol and anticoagulation with apixaban.   COPD (chronic obstructive pulmonary disease) (HCC) No clinical signs of exacerbation, continue bronchodilator therapy as needed.   Essential hypertension Continue blood pressure control with metoprolol.   Chronic  hyponatremia Review of labs suggests chronic hyponatremia.  Sodium 127 this admission.  Could be multifactorial related to hypervolemia, alcohol use, SIADH. -Continue diuresis as above -Check urine and serum osmolality, urine sodium -Repeat labs in a.m.  Alcohol use Reports drinking about 4 beers daily.  No sign of withdrawal.  Continue to monitor.  Tobacco use Smoking 3-4 cigarettes/day.  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  Hyperlipidemia Continue atorvastatin.  Coronary artery disease Hx STEMI 2012 s/p DES pLAD with residual 80% mid RCA stenosis Denies any chest pain.  He has not been on aspirin which he self discontinued due to history of nosebleeds. -Restart aspirin 81 mg daily -Continue Toprol-XL and atorvastatin        Subjective: Patient with improvement of his symptoms but not yet at  baseline, continue to have lower extremity edema and dyspnea.   Physical Exam: Vitals:   04/08/22 1200 04/08/22 1230 04/08/22 1330 04/08/22 1343  BP: (!) 144/95 (!) 143/90 132/79   Pulse: 84 92 66   Resp: 18 17 (!) 23   Temp:    98.5 F (36.9 C)  TempSrc:      SpO2: 98% 97% 93%   Weight:      Height:       Neurology awake and alert ENT with mild pallor Cardiovascular with S1 and S2 present and rhythmic with no gallops, rubs or murmurs Respiratory with bilateral rales with no wheezing or rhonchi Abdomen with no distention Positive lower extremity edema  Data Reviewed:    Family Communication: I spoke with patient's sister over the phone at the bedside, we talked in detail about patient's condition, plan of care and prognosis and  all questions were addressed.   Disposition: Status is: Inpatient Remains inpatient appropriate because: volume overload   Planned Discharge Destination: Home    Author: Tawni Millers, MD 04/08/2022 3:33 PM  For on call review [email or web address].

## 2022-04-08 NOTE — Progress Notes (Signed)
2D Echocardiogram has been performed.  Cory Valentine 04/08/2022, 3:55 PM

## 2022-04-09 ENCOUNTER — Encounter (HOSPITAL_COMMUNITY): Payer: Self-pay | Admitting: Internal Medicine

## 2022-04-09 ENCOUNTER — Other Ambulatory Visit (HOSPITAL_COMMUNITY): Payer: Self-pay

## 2022-04-09 DIAGNOSIS — N179 Acute kidney failure, unspecified: Secondary | ICD-10-CM | POA: Diagnosis not present

## 2022-04-09 DIAGNOSIS — J439 Emphysema, unspecified: Secondary | ICD-10-CM | POA: Diagnosis not present

## 2022-04-09 DIAGNOSIS — N189 Chronic kidney disease, unspecified: Secondary | ICD-10-CM | POA: Diagnosis not present

## 2022-04-09 DIAGNOSIS — I10 Essential (primary) hypertension: Secondary | ICD-10-CM | POA: Diagnosis not present

## 2022-04-09 DIAGNOSIS — I4819 Other persistent atrial fibrillation: Secondary | ICD-10-CM | POA: Diagnosis not present

## 2022-04-09 DIAGNOSIS — I2510 Atherosclerotic heart disease of native coronary artery without angina pectoris: Secondary | ICD-10-CM | POA: Diagnosis not present

## 2022-04-09 DIAGNOSIS — N3000 Acute cystitis without hematuria: Secondary | ICD-10-CM | POA: Diagnosis not present

## 2022-04-09 DIAGNOSIS — I5022 Chronic systolic (congestive) heart failure: Secondary | ICD-10-CM | POA: Diagnosis not present

## 2022-04-09 LAB — BASIC METABOLIC PANEL
Anion gap: 9 (ref 5–15)
BUN: 17 mg/dL (ref 8–23)
CO2: 30 mmol/L (ref 22–32)
Calcium: 8.8 mg/dL — ABNORMAL LOW (ref 8.9–10.3)
Chloride: 92 mmol/L — ABNORMAL LOW (ref 98–111)
Creatinine, Ser: 1.66 mg/dL — ABNORMAL HIGH (ref 0.61–1.24)
GFR, Estimated: 45 mL/min — ABNORMAL LOW (ref >60)
Glucose, Bld: 107 mg/dL — ABNORMAL HIGH (ref 70–99)
Potassium: 3.9 mmol/L (ref 3.5–5.1)
Sodium: 131 mmol/L — ABNORMAL LOW (ref 135–145)

## 2022-04-09 LAB — MAGNESIUM: Magnesium: 1.3 mg/dL — ABNORMAL LOW (ref 1.7–2.4)

## 2022-04-09 MED ORDER — APIXABAN 5 MG PO TABS
5.0000 mg | ORAL_TABLET | Freq: Two times a day (BID) | ORAL | Status: DC
  Administered 2022-04-09 – 2022-04-11 (×5): 5 mg via ORAL
  Filled 2022-04-09 (×5): qty 1

## 2022-04-09 MED ORDER — MAGNESIUM SULFATE 4 GM/100ML IV SOLN
4.0000 g | Freq: Once | INTRAVENOUS | Status: AC
  Administered 2022-04-09: 4 g via INTRAVENOUS
  Filled 2022-04-09: qty 100

## 2022-04-09 NOTE — Evaluation (Signed)
Physical Therapy Evaluation Patient Details Name: Cory Valentine MRN: [id number] DOB: 06-22-1955 Today's Date: 04/09/2022  History of Present Illness  Cory Valentine is a 67 y.o. male with medical history significant for CAD/STEMI 2012 s/p DES pLAD (residual 80% mid RCA stenosis), HFmrEF, HTN, HLD, chronic hyponatremia, EtOH use, tobacco use, recently diagnosed atrial fibrillation not on anticoagulation per patient preference who is admitted with acute on chronic CHF exacerbation.  Clinical Impression  Patient presents with mild deconditioning SOB though SpO2 92% on RA with ambulation.  He ambulated in hallway without device and maintains wearing shoes at home he feels more stable.  Feel he can be managed with nursing assist or mobility specialist for more frequent ambulation.  PT will sign off.    Recommendations for follow up therapy are one component of a multi-disciplinary discharge planning process, led by the attending physician.  Recommendations may be updated based on patient status, additional functional criteria and insurance authorization.  Follow Up Recommendations No PT follow up      Assistance Recommended at Discharge Set up Supervision/Assistance  Patient can return home with the following       Equipment Recommendations None recommended by PT  Recommendations for Other Services       Functional Status Assessment Patient has not had a recent decline in their functional status     Precautions / Restrictions Precautions Precautions: Fall Precaution Comments: neuropathy, but compensates with supportive shoes      Mobility  Bed Mobility Overal bed mobility: Modified Independent                  Transfers Overall transfer level: Modified independent Equipment used: None, Right platform walker                    Ambulation/Gait Ambulation/Gait assistance: Independent   Assistive device: None Gait Pattern/deviations: Step-through pattern,  Decreased stride length       General Gait Details: wider BOS no device, no LOB, step through pattern  Stairs            Wheelchair Mobility    Modified Rankin (Stroke Patients Only)       Balance Overall balance assessment: No apparent balance deficits (not formally assessed)                                           Pertinent Vitals/Pain Pain Assessment Pain Assessment: No/denies pain    Home Living Family/patient expects to be discharged to:: Private residence Living Arrangements: Alone Available Help at Discharge: Friend(s);Available PRN/intermittently Type of Home: House Home Access: Level entry     Alternate Level Stairs-Number of Steps: 2 Home Layout: Two level Home Equipment: None      Prior Function Prior Level of Function : Independent/Modified Independent               ADLs Comments: mows his lawn with riding mower, grocery shops and cooks/cleans     Hand Dominance   Dominant Hand: Right    Extremity/Trunk Assessment   Upper Extremity Assessment Upper Extremity Assessment: Overall WFL for tasks assessed    Lower Extremity Assessment Lower Extremity Assessment: RLE deficits/detail;LLE deficits/detail RLE Deficits / Details: AROM and strength WFL; edema and errythema throughout RLE Sensation: decreased light touch;history of peripheral neuropathy LLE Deficits / Details: AROM and strength WFL; edema and errythema throughout LLE Sensation: decreased light touch  Cervical / Trunk Assessment Cervical / Trunk Assessment: Other exceptions  Communication   Communication: HOH  Cognition Arousal/Alertness: Awake/alert Behavior During Therapy: WFL for tasks assessed/performed Overall Cognitive Status: Within Functional Limits for tasks assessed                                          General Comments      Exercises     Assessment/Plan    PT Assessment Patient does not need any further PT  services  PT Problem List         PT Treatment Interventions      PT Goals (Current goals can be found in the Care Plan section)  Acute Rehab PT Goals PT Goal Formulation: All assessment and education complete, DC therapy    Frequency       Co-evaluation               AM-PAC PT "6 Clicks" Mobility  Outcome Measure Help needed turning from your back to your side while in a flat bed without using bedrails?: None Help needed moving from lying on your back to sitting on the side of a flat bed without using bedrails?: None Help needed moving to and from a bed to a chair (including a wheelchair)?: None Help needed standing up from a chair using your arms (e.g., wheelchair or bedside chair)?: None Help needed to walk in hospital room?: None Help needed climbing 3-5 steps with a railing? : Total 6 Click Score: 21    End of Session Equipment Utilized During Treatment: Gait belt Activity Tolerance: Patient tolerated treatment well Patient left: in bed   PT Visit Diagnosis: Other abnormalities of gait and mobility (R26.89)    Time: 1941-7408 PT Time Calculation (min) (ACUTE ONLY): 19 min   Charges:   PT Evaluation $PT Eval Low Complexity: 1 Low          [name], PT Acute Rehabilitation Services Office:[phone] 04/09/2022   [name] 04/09/2022, 3:12 PM

## 2022-04-09 NOTE — TOC Benefit Eligibility Note (Signed)
Patient Advocate Encounter  Insurance verification completed.    The patient is currently admitted and upon discharge could be taking Eliquis 5 mg.  The current 30 day co-pay is $45.00.   The patient is insured through [employer] Medicare Part D   Esco Joslyn, CPHT Pharmacy Patient Advocate Specialist [facility] Patient Advocate Team Direct Number: [phone]  Fax: [phone]       

## 2022-04-09 NOTE — Progress Notes (Signed)
   Heart Failure Stewardship Pharmacist Progress Note   PCP: Susy Frizzle, MD PCP-Cardiologist: Chalmers Guest, MD    HPI:  67 yo M with PMH of CAD, HTN, HLD, and CHF.  He presented to the ED on 1/23 from his cardiology appt after complaining of shortness of breath and LE that had been ongoing for 6 months. Now complains of orthopnea and chest tightness. CXR without edema. ECHO 1/24 showed LVEF 40-45% (was 45-50% in 2017), regional wall motion abnormalities, RV mildly reduced.   Current HF Medications: Diuretic: furosemide 60 mg IV BID Beta Blocker: metoprolol XL 25 mg daily  Prior to admission HF Medications: Beta blocker: metoprolol XL 25 mg daily ACE/ARB/ARNI: losartan 100 mg daily  Pertinent Lab Values: Serum creatinine 1.66, BUN 17, Potassium 3.9, Sodium 131, BNP 845.2, Magnesium 1.3  Vital Signs: Weight: 198 lbs (admission weight: 203 lbs) Blood pressure: 120-140/70s  Heart rate: 70-80s  I/O: -3L yesterday; net -4.3L  Medication Assistance / Insurance Benefits Check: Does the patient have prescription insurance?  Yes Type of insurance plan: [place]:  Prior to admission outpatient pharmacy: [facility] Is the patient willing to use [facility] at discharge? Yes Is the patient willing to transition their outpatient pharmacy to utilize a [facility] outpatient pharmacy?   Pending    Assessment: 1. Acute on chronic HFmrEF (LVEF 40-45%). NYHA class III symptoms. - Continue furosemide 60 mg IV BID. Strict I/Os and daily weights. Keep K>4 and Mg>2. Magnesium 4g IV x 1 given for replacement.  - Continue metoprolol XL 25 mg daily - Holding ARB with rising creatinine - Consider MRA and SGLT2i prior to discharge once renal function stabilizes   Plan: 1) Medication changes recommended at this time: - Continue IV diuresis - If creatinine improved tomorrow, start spironolactone  2) Patient assistance: - Farxiga/Jardiance  copay $45 - Generic Wilder Glade is not on patient's insurance formulary  - Entresto copay $45  3)  Education  - To be completed prior to discharge  Kerby Nora, PharmD, BCPS Heart Failure Cytogeneticist Phone [phone]

## 2022-04-09 NOTE — Progress Notes (Signed)
Heart Failure Nurse Navigator Progress Note  PCP: Susy Frizzle, MD PCP-Cardiologist: Mall[place] Admission Diagnosis: Hyponatremia, Acute on chronic congestive heart failure.  Admitted from: Cardiology office to ED  Presentation:   Cory Valentine presented with shortness of breath, 3+ LE for 6-8 months, chest tightness, and orthopnea. BP 171/114, HR 93, BNP 845.2, CXR without edema,   Patient was educated on the sign and symptoms of heart failure, daily weights, when to call his doctor or go to the ED, Diet/ fluid restrictions: reported he drinks a lot of water daily and around 3-5 beers per day, Taking all medications as prescribed and attending all medical appointments. Patient verbalized his understanding of education, a hospital HF TOC appointment was scheduled for 04/27/2022 @ 10 am.   ECHO/ LVEF: 40-45 % HFmrEF  Clinical Course:  Past Medical History:  Diagnosis Date   High cholesterol    History of stress test    showed anteroapical scar without ischemia   Hx of echocardiogram 03/25/2011   EF >55% with moderate septal hypokinesia   Hypertension    MI (myocardial infarction) ([place]) 01/13/2011     Social History   Socioeconomic History   Marital status: Single    Spouse name: Not on file   Number of children: 0   Years of education: Not on file   Highest education level: High school graduate  Occupational History   Occupation: retired  Tobacco Use   Smoking status: Former    Packs/day: 0.10    Types: Cigarettes    Quit date: 01/13/2011    Years since quitting: 11.2   Smokeless tobacco: Never  Vaping Use   Vaping Use: Never used  Substance and Sexual Activity   Alcohol use: Yes    Alcohol/week: 28.0 standard drinks of alcohol    Types: 28 Cans of beer per week    Comment: 3-5 beers per day   Drug use: No   Sexual activity: Not on file  Other Topics Concern   Not on file  Social History Narrative   Not on file   Social Determinants of Health    Financial Resource Strain: Low Risk  (04/09/2022)   Overall Financial Resource Strain (CARDIA)    Difficulty of Paying Living Expenses: Not hard at all  Food Insecurity: No Food Insecurity (04/08/2022)   Hunger Vital Sign    Worried About Running Out of Food in the Last Year: Never true    Ran Out of Food in the Last Year: Never true  Transportation Needs: No Transportation Needs (04/08/2022)   PRAPARE - Hydrologist (Medical): No    Lack of Transportation (Non-Medical): No  Physical Activity: Insufficiently Active (02/13/2022)   Exercise Vital Sign    Days of Exercise per Week: 2 days    Minutes of Exercise per Session: 20 min  Stress: No Stress Concern Present (02/13/2022)   [place]    Feeling of Stress : Not at all  Social Connections: Moderately Isolated (02/13/2022)   Social Connection and Isolation Panel [NHANES]    Frequency of Communication with Friends and Family: Twice a week    Frequency of Social Gatherings with Friends and Family: Twice a week    Attends Religious Services: Never    Marine scientist or Organizations: Yes    Attends Archivist Meetings: Never    Marital Status: Divorced   Teacher, early years/pre and Provision:  Detailed education and  instructions provided on heart failure disease management including the following:  Signs and symptoms of Heart Failure When to call the physician Importance of daily weights Low sodium diet Fluid restriction Medication management Anticipated future follow-up appointments  Patient education given on each of the above topics.  Patient acknowledges understanding via teach back method and acceptance of all instructions.  Education Materials:  "Living Better With Heart Failure" Booklet, HF zone tool, & Daily Weight Tracker Tool.  Patient has scale at home: yes Patient has pill box at home: yes    High Risk  Criteria for Readmission and/or Poor Patient Outcomes: Heart failure hospital admissions (last 6 months): 1  No Show rate: 3 % Difficult social situation: No Demonstrates medication adherence: yes Primary Language: English Literacy level: Reading, writing, and comprehension  Barriers of Care:   Continued HF education Diet/ fluid/ daily weights ETOH cessation: drinks about 3-5 beers per day.   Considerations/Referrals:   Referral made to Heart Failure Pharmacist Stewardship: yes Referral made to Heart Failure CSW/NCM TOC: No Referral made to Heart & Vascular TOC clinic: Yes, 04/27/2022 @ 10 am  Items for Follow-up on DC/TOC: Continued HF education Diet/ fluids/ daily weights ETOH cessation: drinks about 3-5 beers per day.    Earnestine Leys, BSN, Clinical cytogeneticist Only

## 2022-04-09 NOTE — Plan of Care (Signed)
  Problem: Education: Goal: Knowledge of General Education information will improve Description: Including pain rating scale, medication(s)/side effects and non-pharmacologic comfort measures Outcome: Progressing   Problem: Clinical Measurements: Goal: Respiratory complications will improve Outcome: Progressing Goal: Cardiovascular complication will be avoided Outcome: Progressing   

## 2022-04-09 NOTE — Progress Notes (Signed)
[facility] for apixaban Indication: atrial fibrillation  No Known Allergies  Patient Measurements: Height: '6\' 1"'$  (185.4 cm) Weight: 89.9 kg (198 lb 3.1 oz) IBW/kg (Calculated) : 79.9   Vital Signs: Temp: 98.3 F (36.8 C) (01/25 1231) Temp Source: Oral (01/25 1231) BP: 141/96 (01/25 1231) Pulse Rate: 79 (01/25 1231)  Labs: Recent Labs    04/07/22 1654 04/08/22 0600 04/09/22 0320  HGB 12.5* 11.7*  --   HCT 36.4* 32.7*  --   PLT 208 179  --   CREATININE 1.42* 1.57* 1.66*    Estimated Creatinine Clearance: 48.1 mL/min (A) (by C-G formula based on SCr of 1.66 mg/dL (H)).   Medical History: Past Medical History:  Diagnosis Date   High cholesterol    History of stress test    showed anteroapical scar without ischemia   Hx of echocardiogram 03/25/2011   EF >55% with moderate septal hypokinesia   Hypertension    MI (myocardial infarction) ([place]) 01/13/2011     Assessment: 67 yoM with recently diagnosed AFib. Pt initially refused anticoagulation, now ok with starting apixaban. Cr >1.5 but age 67 and wt 89kg so qualifies for '5mg'$  BID dosing.  Plan: Apixaban '5mg'$  BID Pharmacy will sign off, reconsult if needed  Arrie Senate, PharmD, BCPS, [facility] Clinical Pharmacist [phone] Please check AMION for all [place] numbers 04/09/2022

## 2022-04-09 NOTE — Progress Notes (Signed)
Mobility Specialist - Progress Note   04/09/22 1458  Mobility  Activity Ambulated independently in hallway  Level of Assistance Independent  Assistive Device None  Distance Ambulated (ft) 240 ft  Activity Response Tolerated well  Mobility Referral Yes  $Mobility charge 1 Mobility   Pt was received in hallway and agreeable to session. No complaints throughout. Pt was returned to bed with all needs met.   Franki Monte  Mobility Specialist Please contact via Solicitor or Rehab office at [phone]

## 2022-04-09 NOTE — Progress Notes (Signed)
Progress Note   Patient: Cory Valentine XBJ:[id number] DOB: 04-29-1955 DOA: 04/07/2022     2 DOS: the patient was seen and examined on 04/09/2022   Brief hospital course: Cory Valentine is a 67 y.o. male with medical history significant for CAD/STEMI 2012 s/p DES pLAD (residual 80% mid RCA stenosis), HFmrEF, HTN, HLD, chronic hyponatremia, EtOH use, tobacco use, recently diagnosed atrial fibrillation not on anticoagulation per patient preference who is admitted with acute on chronic CHF exacerbation.  Reported progressive edema at his lower extremities for the last 6 months, along with dyspnea on exertion, PND and orthopnea. On his initial physical examination his blood pressure was 171/114, HR 93, RR 18 and 02 saturation 98%, lungs with expiratory wheezing, heart with S1 and S2 irregularly irregular, with no murmurs, abdomen with no distention, positive 3 + lower extremity edema up to the thighs.   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 LV mild reduced 40 to 45%, RV with mild reduction in systolic function., LA and RA with moderate dilatation. Mild to moderate TR.   Urine output is 3.086 ml Systolic blood pressure is 133 to 125 mmHg.   Plan to continue diuresis with IV furosemide, close monitoring blood pressure and renal function.  Holding on RAS inhibition until renal function more stable. No SGLT 2 inh due to recent urinary tract infection and pyuria.   Acute kidney injury superimposed on chronic kidney disease (HCC) Hyponatremia CKD stage 3a.   Follow up renal function with serum cr at 1,66 with K at 3,9 and serum bicarbonate at 30, Mg 1,3.  Na 131  Add 4 g amg IV Plan to continue diuresis for hypervolemia, and follow up renal function in am, avoid hypotension and nephrotoxic medications.   Persistent atrial fibrillation (HCC) Continue rate control with metoprolol  and anticoagulation with apixaban.   COPD (chronic obstructive pulmonary disease) (HCC) No clinical signs of exacerbation, continue bronchodilator therapy as needed.   Essential hypertension Continue blood pressure control with metoprolol.   Chronic hyponatremia Review of labs suggests chronic hyponatremia.  Sodium 127 this admission.  Could be multifactorial related to hypervolemia, alcohol use, SIADH. -Continue diuresis as above -Check urine and serum osmolality, urine sodium -Repeat labs in a.m.  Alcohol use Reports drinking about 4 beers daily.  No sign of withdrawal.  Continue to monitor.  Tobacco use Smoking 3-4 cigarettes/day.  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  Hyperlipidemia Continue atorvastatin.  Coronary artery disease Hx STEMI 2012 s/p DES pLAD with residual 80% mid RCA stenosis Denies any chest pain.  He has not been on aspirin which he self discontinued due to history of nosebleeds. -Restart aspirin 81 mg daily -Continue Toprol-XL and atorvastatin        Subjective: Patient is feeling better, dyspnea and edema have improved but not back to baseline, no urinary symptoms   Physical Exam: Vitals:   04/08/22 1950 04/09/22 0002 04/09/22 0406 04/09/22 0816  BP: (!) 157/95 127/77 133/72 125/78  Pulse: 96 77 76 96  Resp: 20 18 (!) 21 19  Temp: 98.3 F (36.8 C) 97.6 F (36.4 C) 98.7 F (37.1 C) 98.5 F (36.9 C)  TempSrc: Oral Axillary Oral Oral  SpO2: 95% 100% 100% 96%  Weight: 92.5 kg  89.9 kg   Height: '6\' 1"'$  (1.854 m)      Neurology awake and alert ENT with no pallor Cardiovascular with S1  and S2 present, irregularly irregular with no gallops, rubs or murmurs Respiratory with no rales or wheezing Abdomen with no distention Positive lower extremity edema ++ pitting Erythema discoloration bilateral legs anterior Data Reviewed:    Family Communication: I spoke with patient's sister at the bedside, we talked in detail about patient's condition,  plan of care and prognosis and all questions were addressed.   Disposition: Status is: Inpatient Remains inpatient appropriate because: IV diuresis   Planned Discharge Destination: Home      Author: Tawni Millers, MD 04/09/2022 11:03 AM  For on call review [email or web address].

## 2022-04-09 NOTE — Care Management (Signed)
  Transition of Care [facility]) Screening Note   Patient Details  Name: Cory Valentine Date of Birth: 1955-11-02   Transition of Care [facility]) CM/SW Contact:    Bethena Roys, RN Phone Number: 04/09/2022, 12:53 PM    Transition of Care Department [facility]) has reviewed the patient and no TOC needs have been identified at this time. Patient presented for shortness of breath and lower extremity edema. Patient initiated on IV Lasix. Case Manager will continue to follow for disposition needs. TOC will continue to monitor patient advancement through interdisciplinary progression rounds. If new patient transition needs arise, please place a TOC consult. '

## 2022-04-10 DIAGNOSIS — I5022 Chronic systolic (congestive) heart failure: Secondary | ICD-10-CM | POA: Diagnosis not present

## 2022-04-10 DIAGNOSIS — J439 Emphysema, unspecified: Secondary | ICD-10-CM | POA: Diagnosis not present

## 2022-04-10 DIAGNOSIS — I4819 Other persistent atrial fibrillation: Secondary | ICD-10-CM | POA: Diagnosis not present

## 2022-04-10 DIAGNOSIS — N179 Acute kidney failure, unspecified: Secondary | ICD-10-CM | POA: Diagnosis not present

## 2022-04-10 LAB — BASIC METABOLIC PANEL
Anion gap: 10 (ref 5–15)
BUN: 22 mg/dL (ref 8–23)
CO2: 32 mmol/L (ref 22–32)
Calcium: 8.8 mg/dL — ABNORMAL LOW (ref 8.9–10.3)
Chloride: 90 mmol/L — ABNORMAL LOW (ref 98–111)
Creatinine, Ser: 1.7 mg/dL — ABNORMAL HIGH (ref 0.61–1.24)
GFR, Estimated: 43 mL/min — ABNORMAL LOW (ref >60)
Glucose, Bld: 126 mg/dL — ABNORMAL HIGH (ref 70–99)
Potassium: 3.8 mmol/L (ref 3.5–5.1)
Sodium: 132 mmol/L — ABNORMAL LOW (ref 135–145)

## 2022-04-10 LAB — MAGNESIUM: Magnesium: 1.7 mg/dL (ref 1.7–2.4)

## 2022-04-10 MED ORDER — MAGNESIUM SULFATE 2 GM/50ML IV SOLN
2.0000 g | Freq: Once | INTRAVENOUS | Status: AC
  Administered 2022-04-10: 2 g via INTRAVENOUS
  Filled 2022-04-10: qty 50

## 2022-04-10 NOTE — Discharge Instructions (Signed)

## 2022-04-10 NOTE — Progress Notes (Signed)
Progress Note   Patient: Cory Valentine HDQ:[id number] DOB: [birth year] DOA: 04/07/2022     3 DOS: the patient was seen and examined on 04/10/2022   Brief hospital course: BHAVIK CABINESS is a 67 y.o. male with medical history significant for CAD/STEMI 2012 s/p DES pLAD (residual 80% mid RCA stenosis), HFmrEF, HTN, HLD, chronic hyponatremia, EtOH use, tobacco use, recently diagnosed atrial fibrillation not on anticoagulation per patient preference who is admitted with acute on chronic CHF exacerbation.  Reported progressive edema at his lower extremities for the last 6 months, along with dyspnea on exertion, PND and orthopnea. On his initial physical examination his blood pressure was 171/114, HR 93, RR 18 and 02 saturation 98%, lungs with expiratory wheezing, heart with S1 and S2 irregularly irregular, with no murmurs, abdomen with no distention, positive 3 + lower extremity edema up to the thighs.   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 LV mild reduced 40 to 45%, RV with mild reduction in systolic function., LA and RA with moderate dilatation. Mild to moderate TR.   Urine output is 1.194 ml Systolic blood pressure is 133 to 125 mmHg.   Plan to continue diuresis with IV furosemide, close monitoring blood pressure and renal function.  Holding on RAS inhibition until renal function more stable. No SGLT 2 inh due to recent urinary tract infection and pyuria.   Acute kidney injury superimposed on chronic kidney disease (HCC) Hyponatremia CKD stage 3a.   Follow up renal function with serum cr at 1,66 with K at 3,9 and serum bicarbonate at 30, Mg 1,3.  Na 131  Add 4 g amg IV Plan to continue diuresis for hypervolemia, and follow up renal function in am, avoid hypotension and nephrotoxic medications.   Persistent atrial fibrillation (HCC) Continue rate control with metoprolol  and anticoagulation with apixaban.   COPD (chronic obstructive pulmonary disease) (HCC) No clinical signs of exacerbation, continue bronchodilator therapy as needed.   Essential hypertension Continue blood pressure control with metoprolol.   Chronic hyponatremia Review of labs suggests chronic hyponatremia.  Sodium 127 this admission.  Could be multifactorial related to hypervolemia, alcohol use, SIADH. -Continue diuresis as above -Check urine and serum osmolality, urine sodium -Repeat labs in a.m.  Alcohol use Reports drinking about 4 beers daily.  No sign of withdrawal.  Continue to monitor.  Tobacco use Smoking 3-4 cigarettes/day.  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  Hyperlipidemia Continue atorvastatin.  Coronary artery disease Hx STEMI 2012 s/p DES pLAD with residual 80% mid RCA stenosis Denies any chest pain.  He has not been on aspirin which he self discontinued due to history of nosebleeds. -Restart aspirin 81 mg daily -Continue Toprol-XL and atorvastatin        Subjective: Patient is feeling better, dyspnea and edema continue to improve.   Physical Exam: Vitals:   04/09/22 2320 04/10/22 0311 04/10/22 0733 04/10/22 1312  BP: 105/67 119/71 132/69 (!) 120/92  Pulse: 84 78 88 73  Resp: '18 20 20 20  '$ Temp: 98 F (36.7 C) 99 F (37.2 C) 98.9 F (37.2 C) 98.4 F (36.9 C)  TempSrc: Oral Oral Oral Oral  SpO2: 92% 94% 91% 93%  Weight:  91 kg    Height:       Neurology awake and alert ENT with no pallor Cardiovascular with S1 and S2 present, irregularly irregular with no gallops, rubs or murmurs Respiratory  with no rales or wheezing Abdomen with no distention  Lower extremity with no edema, ted hose in place.  Data Reviewed:    Family Communication: I spoke with patient's sister at the bedside, we talked in detail about patient's condition, plan of care and prognosis and all questions were addressed.   Disposition: Status is: Inpatient Remains  inpatient appropriate because: follow up on renal function   Planned Discharge Destination: Home      Author: Tawni Millers, MD 04/10/2022 1:31 PM  For on call review [email or web address].

## 2022-04-10 NOTE — Care Management Important Message (Signed)
Important Message  Patient Details  Name: XABI WITTLER MRN: [id number] Date of Birth: [birth year]   Medicare Important Message Given:  Yes     Shelda Altes 04/10/2022, 9:07 AM

## 2022-04-10 NOTE — Evaluation (Signed)
Occupational Therapy Evaluation Patient Details Name: Cory Valentine MRN: [id number] DOB: 1955-09-29 Today's Date: 04/10/2022   History of Present Illness Cory Valentine is a 67 y.o. male with medical history significant for CAD/STEMI 2012 s/p DES pLAD (residual 80% mid RCA stenosis), HFmrEF, HTN, HLD, chronic hyponatremia, EtOH use, tobacco use, recently diagnosed atrial fibrillation not on anticoagulation per patient preference who is admitted with acute on chronic CHF exacerbation.   Clinical Impression   Patient admitted for above and presents with problem list below. PTA independent.  Pt presenting at baseline independent level for ADLs and mobility.  Extensive discussion on management of CHF, pt voiced understanding and asking appropriate questions- pt able to verbalize steps to take in mgmt of CHF at end of session. No further OT needs identified at this time. OT will sign off.      Recommendations for follow up therapy are one component of a multi-disciplinary discharge planning process, led by the attending physician.  Recommendations may be updated based on patient status, additional functional criteria and insurance authorization.   Follow Up Recommendations  No OT follow up     Assistance Recommended at Discharge None  Patient can return home with the following      Functional Status Assessment  Patient has not had a recent decline in their functional status  Equipment Recommendations  None recommended by OT    Recommendations for Other Services       Precautions / Restrictions Precautions Precautions: Fall Precaution Comments: neuropathy, but compensates with supportive shoes      Mobility Bed Mobility Overal bed mobility: Modified Independent                  Transfers Overall transfer level: Modified independent                        Balance Overall balance assessment: No apparent balance deficits (not formally assessed)                                          ADL either performed or assessed with clinical judgement   ADL Overall ADL's : Modified independent                                             Vision   Vision Assessment?: No apparent visual deficits     Perception     Praxis      Pertinent Vitals/Pain Pain Assessment Pain Assessment: No/denies pain     Hand Dominance Right   Extremity/Trunk Assessment Upper Extremity Assessment Upper Extremity Assessment: Overall WFL for tasks assessed   Lower Extremity Assessment Lower Extremity Assessment: Defer to PT evaluation       Communication Communication Communication: HOH   Cognition Arousal/Alertness: Awake/alert Behavior During Therapy: WFL for tasks assessed/performed Overall Cognitive Status: Within Functional Limits for tasks assessed                                       General Comments  Extensive education on CHF management.  Pt asking appropriate questions and very well aware of how to manage CHF at home.    Exercises  Shoulder Instructions      Home Living Family/patient expects to be discharged to:: Private residence Living Arrangements: Alone Available Help at Discharge: Friend(s);Available PRN/intermittently Type of Home: House Home Access: Level entry     Home Layout: Two level Alternate Level Stairs-Number of Steps: 2   Bathroom Shower/Tub: Occupational psychologist: Standard     Home Equipment: None          Prior Functioning/Environment Prior Level of Function : Independent/Modified Independent               ADLs Comments: mows his lawn with riding mower, grocery shops and cooks/cleans        OT Problem List:        OT Treatment/Interventions:      OT Goals(Current goals can be found in the care plan section) Acute Rehab OT Goals Patient Stated Goal: home OT Goal Formulation: With patient  OT Frequency:      Co-evaluation               AM-PAC OT "6 Clicks" Daily Activity     Outcome Measure Help from another person eating meals?: None Help from another person taking care of personal grooming?: None Help from another person toileting, which includes using toliet, bedpan, or urinal?: None Help from another person bathing (including washing, rinsing, drying)?: None Help from another person to put on and taking off regular upper body clothing?: None Help from another person to put on and taking off regular lower body clothing?: None 6 Click Score: 24   End of Session Nurse Communication: Mobility status  Activity Tolerance: Patient tolerated treatment well Patient left: with call bell/phone within reach;Other (comment);with family/visitor present (EOB)  OT Visit Diagnosis: Muscle weakness (generalized) (M62.81)                Time: 3235-5732 OT Time Calculation (min): 14 min Charges:  OT General Charges $OT Visit: 1 Visit OT Evaluation $OT Eval Low Complexity: 1 Low  Jolaine Artist, OT Acute Rehabilitation Services Office [phone]   Delight Stare 04/10/2022, 1:23 PM

## 2022-04-10 NOTE — Progress Notes (Signed)
Mobility Specialist - Progress Note   04/10/22 0956  Mobility  Activity Ambulated independently in hallway  Level of Assistance Independent  Assistive Device None  Distance Ambulated (ft) 470 ft  Activity Response Tolerated well  $Mobility charge 1 Mobility   Pt was received in hallway and agreeable. No complaints throughout session. Pt was left at nurses station with all needs met and family present.   Franki Monte  Mobility Specialist Please contact via Solicitor or Rehab office at [phone]

## 2022-04-10 NOTE — Progress Notes (Signed)
   Heart Failure Stewardship Pharmacist Progress Note   PCP: Susy Frizzle, MD PCP-Cardiologist: Chalmers Guest, MD    HPI:  67 yo M with PMH of CAD, HTN, HLD, and CHF.  He presented to the ED on 1/23 from his cardiology appt after complaining of shortness of breath and LE that had been ongoing for 6 months. Now complains of orthopnea and chest tightness. CXR without edema. ECHO 1/24 showed LVEF 40-45% (was 45-50% in 2017), regional wall motion abnormalities, RV mildly reduced.   Current HF Medications: Beta Blocker: metoprolol XL 25 mg daily  Prior to admission HF Medications: Beta blocker: metoprolol XL 25 mg daily ACE/ARB/ARNI: losartan 100 mg daily  Pertinent Lab Values: Serum creatinine 1.70, BUN 22, Potassium 3.8, Sodium 132, BNP 845.2, Magnesium 1.7  Vital Signs: Weight: 200 lbs (admission weight: 203 lbs) Blood pressure: 90-130/60s  Heart rate: 70-90s  I/O: -1.5L yesterday; net -6L  Medication Assistance / Insurance Benefits Check: Does the patient have prescription insurance?  Yes Type of insurance plan: [place]:  Prior to admission outpatient pharmacy: [facility] Is the patient willing to use [facility] at discharge? Yes Is the patient willing to transition their outpatient pharmacy to utilize a [facility] outpatient pharmacy?   Pending    Assessment: 1. Acute on chronic HFmrEF (LVEF 40-45%). NYHA class III symptoms. - Off IV lasix. Strict I/Os and daily weights. Keep K>4 and Mg>2. - Continue metoprolol XL 25 mg daily - Holding ARB with rising creatinine - Consider MRA and SGLT2i prior to discharge once renal function stabilizes   Plan: 1) Medication changes recommended at this time: - If creatinine improved tomorrow, start spironolactone  2) Patient assistance: - Farxiga/Jardiance copay $45 - Generic Wilder Glade is not on patient's insurance formulary  - Entresto copay $45  3)  Education  - To be completed  prior to discharge  Kerby Nora, PharmD, BCPS Heart Failure Cytogeneticist Phone [phone]

## 2022-04-11 DIAGNOSIS — N179 Acute kidney failure, unspecified: Secondary | ICD-10-CM | POA: Diagnosis not present

## 2022-04-11 DIAGNOSIS — I4819 Other persistent atrial fibrillation: Secondary | ICD-10-CM | POA: Diagnosis not present

## 2022-04-11 DIAGNOSIS — N390 Urinary tract infection, site not specified: Secondary | ICD-10-CM | POA: Diagnosis present

## 2022-04-11 DIAGNOSIS — I2510 Atherosclerotic heart disease of native coronary artery without angina pectoris: Secondary | ICD-10-CM

## 2022-04-11 DIAGNOSIS — N3000 Acute cystitis without hematuria: Secondary | ICD-10-CM

## 2022-04-11 DIAGNOSIS — J439 Emphysema, unspecified: Secondary | ICD-10-CM | POA: Diagnosis not present

## 2022-04-11 DIAGNOSIS — I5022 Chronic systolic (congestive) heart failure: Secondary | ICD-10-CM | POA: Diagnosis not present

## 2022-04-11 LAB — BASIC METABOLIC PANEL
Anion gap: 10 (ref 5–15)
BUN: 27 mg/dL — ABNORMAL HIGH (ref 8–23)
CO2: 30 mmol/L (ref 22–32)
Calcium: 8.5 mg/dL — ABNORMAL LOW (ref 8.9–10.3)
Chloride: 91 mmol/L — ABNORMAL LOW (ref 98–111)
Creatinine, Ser: 1.74 mg/dL — ABNORMAL HIGH (ref 0.61–1.24)
GFR, Estimated: 42 mL/min — ABNORMAL LOW (ref >60)
Glucose, Bld: 99 mg/dL (ref 70–99)
Potassium: 3.8 mmol/L (ref 3.5–5.1)
Sodium: 131 mmol/L — ABNORMAL LOW (ref 135–145)

## 2022-04-11 LAB — MAGNESIUM: Magnesium: 1.8 mg/dL (ref 1.7–2.4)

## 2022-04-11 MED ORDER — CIPROFLOXACIN HCL 250 MG PO TABS: 250.0000 mg | ORAL_TABLET | Freq: Every day | ORAL | 0 refills | Status: AC

## 2022-04-11 MED ORDER — FUROSEMIDE 20 MG PO TABS: 20.0000 mg | ORAL_TABLET | Freq: Every day | ORAL | 0 refills | Status: DC

## 2022-04-11 MED ORDER — APIXABAN 5 MG PO TABS: 5.0000 mg | ORAL_TABLET | Freq: Two times a day (BID) | ORAL | 0 refills | Status: DC

## 2022-04-11 MED ORDER — FUROSEMIDE 20 MG PO TABS
20.0000 mg | ORAL_TABLET | Freq: Every day | ORAL | 0 refills | Status: DC
  Filled 2022-04-11: qty 30, 30d supply, fill #0

## 2022-04-11 MED ORDER — APIXABAN 5 MG PO TABS
5.0000 mg | ORAL_TABLET | Freq: Two times a day (BID) | ORAL | 0 refills | Status: DC
  Filled 2022-04-11: qty 60, 30d supply, fill #0

## 2022-04-11 MED ORDER — CIPROFLOXACIN HCL 250 MG PO TABS
250.0000 mg | ORAL_TABLET | Freq: Every day | ORAL | 0 refills | Status: DC
  Filled 2022-04-11: qty 2, 2d supply, fill #0

## 2022-04-11 MED ORDER — FUROSEMIDE 20 MG PO TABS: 20.0000 mg | ORAL_TABLET | Freq: Every day | ORAL | Status: DC

## 2022-04-11 MED ORDER — CIPROFLOXACIN HCL 500 MG PO TABS
250.0000 mg | ORAL_TABLET | Freq: Every day | ORAL | Status: DC
  Administered 2022-04-11: 250 mg via ORAL
  Filled 2022-04-11: qty 1

## 2022-04-11 NOTE — Assessment & Plan Note (Signed)
Admission urine analysis with persistent pyuria.  Patient had outpatient therapy with cephalexin. Currently with no dysuria but has some difficulty starting to urinate.   Urine culture is positive for E coli resistant to cephalexin.  Will plan to give 3 days of ciprofloxacin and follow up with Urology as planned.

## 2022-04-11 NOTE — Progress Notes (Signed)
Mobility Specialist - Progress Note   04/11/22 1043  Mobility  Activity Ambulated independently in hallway  Level of Assistance Independent  Assistive Device None  Distance Ambulated (ft) 500 ft  Activity Response Tolerated well  Mobility Referral Yes  $Mobility charge 1 Mobility    Pt received sitting EOB agreeable to mobility. No complaints throughout. Left sitting EOB w/ all needs met.   [place] Specialist Please contact via SecureChat or Rehab office at [phone]

## 2022-04-11 NOTE — Plan of Care (Signed)

## 2022-04-11 NOTE — Discharge Summary (Addendum)
Physician Discharge Summary   Patient: Cory Valentine MRN: [id number] DOB: October 11, 1955  Admit date:     04/07/2022  Discharge date: 04/11/22  Discharge Physician: Jimmy Picket Mohini Heathcock   PCP: Susy Frizzle, MD   Recommendations at discharge:    Plan to continue diuresis with furosemide 20 mg daily and increase to 40 mg daily in case of weight gain 2 to 3 lbs in 24 hrs or 5 lbs in 7 days, lower extremity edema or dyspnea.  3 days of ciprofloxacin for multidrug resistant E coli urine infection.  Continue anticoagulation with apixaban.  Follow up with Dr Dennard Schaumann next week, he needs renal function and electrolytes to be checked, 01/29 or 01/30.  Follow up with Cardiology as outpatient and heart failure clinic.   I spoke with patient's sister at the bedside, we talked in detail about patient's condition, plan of care and prognosis and all questions were addressed.   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 *  Hospital Course: Cory Valentine was admitted to the hospital with the working diagnosis of heart failure exacerbation.   67 y.o. male with medical history significant for CAD/STEMI 2012 s/p DES pLAD (residual 80% mid RCA stenosis), HFmrEF, HTN, HLD, chronic hyponatremia, EtOH use, tobacco use, recently diagnosed atrial fibrillation not on anticoagulation per patient preference who is admitted with acute on chronic CHF exacerbation.  Reported progressive edema at his lower extremities for the last 6 months, along with dyspnea on exertion, PND and orthopnea. On his initial physical  examination his blood pressure was 171/114, HR 93, RR 18 and 02 saturation 98%, lungs with expiratory wheezing, heart with S1 and S2 irregularly irregular, with no murmurs, abdomen with no distention, positive 3 + lower extremity edema up to the thighs.   Na 127, K 4.6 CL 91, bicarbonate 30, glucose 62 bun 13 cr 1,42.  BNP 845.2  Wbc 6,5 hgb 12.5 plt 208  Urine analysis with SG 1.011, 100 protein, large leukocytes, urine osmolality 385, urinary NA 75   Chest radiograph with cardiomegaly, with bilateral hilar vascular congestion, right base atelectasis and small bilateral pleural effusions. Sternotomy wires in place.  EKG 100 bpm, right axis, right bundle branch block, normal qtc, atrial fibrillation rhythm with poor R R wave progression, with no significant ST segment or T wave changes.   Patient was placed on IV furosemide for diuresis, negative fluid balance was achieved with good toleration.  Patient was placed on anticoagulation for atrial fibrillation.  Urine culture positive for E coli resistant to cephalexin.   Plan to follow up as outpatient renal function next week. Continue diuresis with furosemide.    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 LV mild reduced 40 to 45%, LV with mild enlarged cavity, distal septal and apical hypokinesis (old).  RV with mild reduction in systolic function., LA and RA with moderate dilatation. Mild to moderate TR.   Patient was placed on IV furosemide for diuresis, negative fluid balance was achieved, -6,012 ml, with significant improvement in his symptoms.  No  SGLT 2 inh due to recent urinary tract infection and pyuria.  Continue metoprolol succinate.  Resume furosemide 20 mg in am, and instructed to increase to 40 mg daily in case of volume overload.  Ted hose for peripheral edema.  Follow up with heart failure clinic as outpatient   Possible addition of ARB as outpatient if stable renal function.  Currently limited pharmacologic options due to reduced GFR.   Acute kidney injury superimposed on chronic kidney disease (HCC) Hyponatremia, hypomagnesemia. CKD stage 3a.   Renal function has stabilized with a serum cr at the time of his discharge of 1,74 with K at 3,8 and serum bicarbonate at 30. Na 131 and Mg 1,8    Plan to resume diuretic therapy on 01/28 and follow up renal function next week.  Increase furosemide to 40 mg daily in case of volume overload.   Persistent atrial fibrillation (HCC) Continue rate control with metoprolol and anticoagulation with apixaban.   COPD (chronic obstructive pulmonary disease) (HCC) No clinical signs of exacerbation, continue bronchodilator therapy as needed.   Essential hypertension Continue blood pressure control with metoprolol.   Coronary artery disease Hx STEMI 2012 s/p DES pLAD with residual 80% mid RCA stenosis Denies any chest pain. -Continue Toprol-XL and atorvastatin  UTI (urinary tract infection) Admission urine analysis with persistent pyuria.  Patient had outpatient therapy with cephalexin. Currently with no dysuria but has some difficulty starting to urinate.   Urine culture is positive for E coli resistant to cephalexin.  Will plan to give 3 days of ciprofloxacin and follow up with Urology as planned.    Alcohol use Reports drinking about 4 beers daily.  No sign of withdrawal.  Continue to monitor.  Tobacco use Smoking 3-4 cigarettes/day.  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  Hyperlipidemia Continue atorvastatin.         Consultants: none Procedures performed: none  Disposition: Home Diet recommendation:  Cardiac diet DISCHARGE MEDICATION: Allergies as of 04/11/2022   No Known Allergies      Medication List     STOP taking these medications    losartan 100 MG tablet Commonly known as: COZAAR       TAKE these medications     apixaban 5 MG Tabs tablet Commonly known as: ELIQUIS Take 1 tablet (5 mg total) by mouth 2 (two) times daily.   atorvastatin 10 MG tablet Commonly known as: LIPITOR TAKE 1 TABLET BY MOUTH ONCE DAILY What changed:  how much to take how to take this when to take this additional instructions   ciprofloxacin 250 MG tablet Commonly known as: CIPRO Take 1 tablet (250 mg total) by mouth daily for 2 days. Start taking on: April 12, 2022   furosemide 20 MG tablet Commonly known as: LASIX Take 1 tablet (20 mg total) by mouth daily. Start taking on: April 12, 2022   metoprolol succinate 25 MG 24 hr tablet Commonly known as: TOPROL-XL Take 1 tablet by mouth once daily        Follow-up Information     [facility] [place]. Go in 17 day(s).   Specialty: Cardiology Why: Hospital follow up 04/27/2022 @ 10 am PLEASE bring a current medication list to appointment FREE valet parking, Entrance C, off [address] Contact information: [address] [id number] [place] [phone]               Discharge Exam: Filed Weights   04/09/22 0406 04/10/22 6168  04/11/22 0523  Weight: 89.9 kg 91 kg 88.5 kg   BP (!) 137/95   Pulse 90   Temp 98.5 F (36.9 C) (Oral)   Resp 20   Ht '6\' 1"'$  (1.854 m)   Wt 88.5 kg   SpO2 91%   BMI 25.75 kg/m   Patient is feeling better, edema has resolved, no chest pain or dyspnea  Neurology awake and alert ENT with no pallor Cardiovascular with S1 and S2 present, irregularly irregular with no gallops, rubs or murmurs Respiratory with no rales or wheezing Abdomen with no distention  No lower extremity edema, ted hose in place   Condition at discharge: stable  The results of significant diagnostics from this hospitalization (including imaging, microbiology, ancillary and laboratory) are listed below for reference.   Imaging Studies: ECHOCARDIOGRAM  COMPLETE  Result Date: 04/08/2022    ECHOCARDIOGRAM REPORT   Patient Name:   RAYSHON ALBAUGH Date of Exam: 04/08/2022 Medical Rec #:  [id number]       Height:       73.0 in Accession #:    [id number]      Weight:       212.0 lb Date of Birth:  Nov 11, 1955        BSA:          2.206 m Patient Age:    35 years        BP:           143/90 mmHg Patient Gender: M               HR:           99 bpm. Exam Location:  Inpatient Procedure: 2D Echo, Cardiac Doppler and Color Doppler Indications:    CHF I50.9  History:        Patient has no prior history of Echocardiogram examinations.                 CHF, CAD and Previous Myocardial Infarction, COPD,                 Arrythmias:Atrial Fibrillation; Risk Factors:Hypertension,                 Dyslipidemia and Current Smoker.  Sonographer:    Ronny Flurry Referring Phys: [id number]  1. Distal septal and apical hypokinesis . Left ventricular ejection fraction, by estimation, is 40 to 45%. The left ventricle has mildly decreased function. The left ventricle demonstrates regional wall motion abnormalities (see scoring diagram/findings  for description). The left ventricular internal cavity size was mildly dilated. Left ventricular diastolic parameters are indeterminate.  2. Right ventricular systolic function is mildly reduced. The right ventricular size is normal.  3. Left atrial size was moderately dilated.  4. Right atrial size was moderately dilated.  5. The mitral valve is abnormal. Mild mitral valve regurgitation. No evidence of mitral stenosis.  6. Tricuspid valve regurgitation is mild to moderate.  7. The aortic valve is tricuspid. Aortic valve regurgitation is not visualized. No aortic stenosis is present.  8. The inferior vena cava is dilated in size with >50% respiratory variability, suggesting right atrial pressure of 8 mmHg. FINDINGS  Left Ventricle: Distal septal and apical hypokinesis. Left ventricular ejection fraction, by estimation, is 40  to 45%. The left ventricle has mildly decreased function. The left ventricle demonstrates regional wall motion abnormalities. The left ventricular internal cavity size was mildly dilated. There is no left ventricular hypertrophy. Left ventricular diastolic parameters are  indeterminate. Right Ventricle: The right ventricular size is normal. No increase in right ventricular wall thickness. Right ventricular systolic function is mildly reduced. Left Atrium: Left atrial size was moderately dilated. Right Atrium: Right atrial size was moderately dilated. Pericardium: There is no evidence of pericardial effusion. Mitral Valve: The mitral valve is abnormal. There is mild thickening of the mitral valve leaflet(s). There is mild calcification of the mitral valve leaflet(s). Mild mitral valve regurgitation. No evidence of mitral valve stenosis. Tricuspid Valve: The tricuspid valve is normal in structure. Tricuspid valve regurgitation is mild to moderate. No evidence of tricuspid stenosis. Aortic Valve: The aortic valve is tricuspid. Aortic valve regurgitation is not visualized. No aortic stenosis is present. Aortic valve mean gradient measures 3.0 mmHg. Aortic valve peak gradient measures 5.9 mmHg. Aortic valve area, by VTI measures 2.29 cm. Pulmonic Valve: The pulmonic valve was normal in structure. Pulmonic valve regurgitation is mild. No evidence of pulmonic stenosis. Aorta: The aortic root is normal in size and structure. Venous: The inferior vena cava is dilated in size with greater than 50% respiratory variability, suggesting right atrial pressure of 8 mmHg. IAS/Shunts: No atrial level shunt detected by color flow Doppler.  LEFT VENTRICLE PLAX 2D LVIDd:         4.70 cm   Diastology LVIDs:         2.70 cm   LV e' lateral:   0.11 cm/s LV PW:         0.80 cm   LV E/e' lateral: 8.9 LV IVS:        0.80 cm LVOT diam:     2.20 cm LV SV:         54 LV SV Index:   25 LVOT Area:     3.80 cm  RIGHT VENTRICLE            IVC RV S  prime:     6.38 cm/s  IVC diam: 2.50 cm LEFT ATRIUM             Index        RIGHT ATRIUM           Index LA diam:        4.70 cm 2.13 cm/m   RA Area:     22.90 cm LA Vol (A2C):   65.7 ml 29.79 ml/m  RA Volume:   72.40 ml  32.83 ml/m LA Vol (A4C):   77.5 ml 35.14 ml/m LA Biplane Vol: 72.2 ml 32.74 ml/m  AORTIC VALVE AV Area (Vmax):    2.52 cm AV Area (Vmean):   2.38 cm AV Area (VTI):     2.29 cm AV Vmax:           121.00 cm/s AV Vmean:          84.500 cm/s AV VTI:            0.238 m AV Peak Grad:      5.9 mmHg AV Mean Grad:      3.0 mmHg LVOT Vmax:         80.20 cm/s LVOT Vmean:        52.900 cm/s LVOT VTI:          0.143 m LVOT/AV VTI ratio: 0.60  AORTA Ao Root diam: 3.50 cm Ao Asc diam:  3.60 cm MV E velocity: 0.95 cm/s  TRICUSPID VALVE  TR Peak grad:   46.8 mmHg                           TR Vmax:        342.00 cm/s                            SHUNTS                           Systemic VTI:  0.14 m                           Systemic Diam: 2.20 cm Jenkins Rouge MD Electronically signed by Jenkins Rouge MD Signature Date/Time: 04/08/2022/4:12:50 PM    Final    DG Chest 2 View  Result Date: 04/07/2022 CLINICAL DATA:  Shortness of breath EXAM: CHEST - 2 VIEW COMPARISON:  01/02/2021, 01/15/2011 FINDINGS: Mild bronchitic changes. Similar pleural and parenchymal scarring at the right base. Post sternotomy changes. Borderline cardiomegaly. No pneumothorax IMPRESSION: No active cardiopulmonary disease. Chronic changes at the right base. Electronically Signed   By: Donavan Foil M.D.   On: 04/07/2022 17:35   US RENAL  Result Date: 03/25/2022 CLINICAL DATA:  Elevated creatinine EXAM: RENAL / URINARY TRACT ULTRASOUND COMPLETE COMPARISON:  None Available. FINDINGS: Right Kidney: Renal measurements: 12.7 x 5.4 x 5.2 cm = volume: 193.1 mL. Moderate hydronephrosis. Left Kidney: Renal measurements: 13.2 x 5.7 x 6.5 cm = volume: 256 mL. Mild hydronephrosis. Bladder: The bladder is distended with  a prevoid volume of 1326 cc. The patient was unable to void. Other: None. IMPRESSION: 1. Moderate hydronephrosis on the right and mild hydronephrosis on the left. 2. The bladder is distended with a prevoid volume of 1326 cc. The patient was unable to void. Electronically Signed   By: Dorise Bullion III M.D.   On: 03/25/2022 16:31    Microbiology: Results for orders placed or performed in visit on 03/25/22  Urine Culture     Status: Abnormal   Collection Time: 03/25/22 11:59 AM   Specimen: Urine  Result Value Ref Range Status   MICRO NUMBER: [id number]  Final   SPECIMEN QUALITY: Adequate  Final   Sample Source NOT GIVEN  Final   STATUS: FINAL  Final   ISOLATE 1: Escherichia coli (A)  Final    Comment: 10,000-49,000 CFU/mL of Escherichia coli      Susceptibility   Escherichia coli - URINE CULTURE, REFLEX    AMOX/CLAVULANIC >=32 Resistant     AMPICILLIN >=32 Resistant     AMPICILLIN/SULBACTAM 16 Intermediate     CEFAZOLIN* >=64 Resistant      * For uncomplicated UTI caused by E. coli, K. pneumoniae or P. mirabilis: Cefazolin is susceptible if MIC <32 mcg/mL and predicts susceptible to the oral agents cefaclor, cefdinir, cefpodoxime, cefprozil, cefuroxime, cephalexin and loracarbef.     CEFTAZIDIME 8 Intermediate     CEFEPIME <=1 Sensitive     CEFTRIAXONE 8 Resistant     CIPROFLOXACIN <=0.25 Sensitive     LEVOFLOXACIN <=0.12 Sensitive     GENTAMICIN <=1 Sensitive     IMIPENEM <=0.25 Sensitive     NITROFURANTOIN <=16 Sensitive     PIP/TAZO <=4 Sensitive     TOBRAMYCIN <=1 Sensitive     TRIMETH/SULFA* <=20 Sensitive      * For uncomplicated UTI caused by  E. coli, K. pneumoniae or P. mirabilis: Cefazolin is susceptible if MIC <32 mcg/mL and predicts susceptible to the oral agents cefaclor, cefdinir, cefpodoxime, cefprozil, cefuroxime, cephalexin and loracarbef. Legend: S = Susceptible  I = Intermediate R = Resistant  NS = Not susceptible * = Not tested  NR = Not reported **NN  = See antimicrobic comments   MICROSCOPIC MESSAGE     Status: None   Collection Time: 03/25/22 12:00 PM  Result Value Ref Range Status   Note   Final    Comment: This urine was analyzed for the presence of WBC,  RBC, bacteria, casts, and other formed elements.  Only those elements seen were reported. . .     Labs: CBC: Recent Labs  Lab 04/07/22 1654 04/08/22 0600  WBC 6.5 6.0  NEUTROABS 4.5  --   HGB 12.5* 11.7*  HCT 36.4* 32.7*  MCV 96.3 93.7  PLT 208 793   Basic Metabolic Panel: Recent Labs  Lab 04/07/22 1654 04/08/22 0600 04/09/22 0320 04/10/22 0315 04/11/22 0105  NA 127* 127* 131* 132* 131*  K 4.6 4.5 3.9 3.8 3.8  CL 91* 92* 92* 90* 91*  CO2 '30 24 30 '$ 32 30  GLUCOSE 62* 104* 107* 126* 99  BUN '13 16 17 22 '$ 27*  CREATININE 1.42* 1.57* 1.66* 1.70* 1.74*  CALCIUM 9.0 8.8* 8.8* 8.8* 8.5*  MG  --   --  1.3* 1.7 1.8   Liver Function Tests: No results for input(s): "AST", "ALT", "ALKPHOS", "BILITOT", "PROT", "ALBUMIN" in the last 168 hours. CBG: Recent Labs  Lab 04/07/22 2015  GLUCAP 123*    Discharge time spent: greater than 30 minutes.  Signed: Tawni Millers, MD [facility] 04/11/2022

## 2022-04-13 ENCOUNTER — Telehealth: Payer: Self-pay

## 2022-04-13 ENCOUNTER — Other Ambulatory Visit (HOSPITAL_COMMUNITY): Payer: Self-pay

## 2022-04-13 NOTE — Patient Outreach (Signed)
  Care Coordination [facility] Note Transition Care Management Follow-up Telephone Call Date of discharge and from where: 04/11/22 Cory Valentine dx acute on chronic heart failure How have you been since you were released from the hospital? I am breathing better. Denies any swelling.  States he is weighting every day and he weighted 190.2 this morning Any questions or concerns? No  Items Reviewed: Did the pt receive and understand the discharge instructions provided? Yes  Medications obtained and verified? Yes  Other? No  Any new allergies since your discharge? No  Dietary orders reviewed? Yes low sodium Do you have support at home? Yes  sister  [name] and Equipment/Supplies: Were home health services ordered? no If so, what is the name of the agency? N/a  Has the agency set up a time to come to the patient's home? not applicable Were any new equipment or medical supplies ordered?  No What is the name of the medical supply agency? N/a Were you able to get the supplies/equipment? not applicable Do you have any questions related to the use of the equipment or supplies? No  Functional Questionnaire: (I = Independent and D = Dependent) ADLs: I  Bathing/Dressing- I  Meal Prep- I  Eating- I  Maintaining continence- I  Transferring/Ambulation- I  Managing Meds- I  Follow up appointments reviewed:  PCP Hospital f/u appt confirmed? Yes  Scheduled to see Dr.Pickard for lab on 04/14/22 @ 8:30AM. [name] f/u appt confirmed? Yes  Scheduled to see Dr. Dellia Cloud cardiology  on 04/21/22 @ 3:40 PM. Are transportation arrangements needed? No  If their condition worsens, is the pt aware to call PCP or go to the Emergency Dept.? Yes Was the patient provided with contact information for the PCP's office or ED? Yes Was to pt encouraged to call back with questions or concerns? Yes  SDOH assessments and interventions completed:   Yes SDOH Interventions Today    Flowsheet Row Most Recent  Value  SDOH Interventions   Food Insecurity Interventions Intervention Not Indicated  Transportation Interventions Intervention Not Indicated       Care Coordination Interventions:  No Care Coordination interventions needed at this time. Declines RNCM follow up.  Reviewed s/sx of HF and when to call provider    Encounter Outcome:  Pt. Visit Completed   Peter Garter RN, [facility], CDE Care Management Coordinator [place] [facility] [phone]

## 2022-04-14 ENCOUNTER — Other Ambulatory Visit: Payer: No Typology Code available for payment source

## 2022-04-14 DIAGNOSIS — R7989 Other specified abnormal findings of blood chemistry: Secondary | ICD-10-CM

## 2022-04-14 DIAGNOSIS — I10 Essential (primary) hypertension: Secondary | ICD-10-CM

## 2022-04-14 DIAGNOSIS — E871 Hypo-osmolality and hyponatremia: Secondary | ICD-10-CM

## 2022-04-14 DIAGNOSIS — Z789 Other specified health status: Secondary | ICD-10-CM

## 2022-04-15 LAB — COMPREHENSIVE METABOLIC PANEL
AG Ratio: 1.2 (calc) (ref 1.0–2.5)
ALT: 8 U/L — ABNORMAL LOW (ref 9–46)
AST: 18 U/L (ref 10–35)
Albumin: 3.8 g/dL (ref 3.6–5.1)
Alkaline phosphatase (APISO): 101 U/L (ref 35–144)
BUN/Creatinine Ratio: 17 (calc) (ref 6–22)
BUN: 28 mg/dL — ABNORMAL HIGH (ref 7–25)
CO2: 33 mmol/L — ABNORMAL HIGH (ref 20–32)
Calcium: 9 mg/dL (ref 8.6–10.3)
Chloride: 94 mmol/L — ABNORMAL LOW (ref 98–110)
Creat: 1.61 mg/dL — ABNORMAL HIGH (ref 0.70–1.35)
Globulin: 3.2 g/dL (calc) (ref 1.9–3.7)
Glucose, Bld: 107 mg/dL — ABNORMAL HIGH (ref 65–99)
Potassium: 4.4 mmol/L (ref 3.5–5.3)
Sodium: 135 mmol/L (ref 135–146)
Total Bilirubin: 0.9 mg/dL (ref 0.2–1.2)
Total Protein: 7 g/dL (ref 6.1–8.1)

## 2022-04-15 LAB — CBC WITH DIFFERENTIAL/PLATELET
Absolute Monocytes: 497 cells/uL (ref 200–950)
Basophils Absolute: 31 cells/uL (ref 0–200)
Basophils Relative: 0.7 %
Eosinophils Absolute: 101 cells/uL (ref 15–500)
Eosinophils Relative: 2.3 %
HCT: 35.2 % — ABNORMAL LOW (ref 38.5–50.0)
Hemoglobin: 11.8 g/dL — ABNORMAL LOW (ref 13.2–17.1)
Lymphs Abs: 880 cells/uL (ref 850–3900)
MCH: 33.1 pg — ABNORMAL HIGH (ref 27.0–33.0)
MCHC: 33.5 g/dL (ref 32.0–36.0)
MCV: 98.6 fL (ref 80.0–100.0)
MPV: 9.8 fL (ref 7.5–12.5)
Monocytes Relative: 11.3 %
Neutro Abs: 2891 cells/uL (ref 1500–7800)
Neutrophils Relative %: 65.7 %
Platelets: 189 10*3/uL (ref 140–400)
RBC: 3.57 10*6/uL — ABNORMAL LOW (ref 4.20–5.80)
RDW: 13.9 % (ref 11.0–15.0)
Total Lymphocyte: 20 %
WBC: 4.4 10*3/uL (ref 3.8–10.8)

## 2022-04-21 ENCOUNTER — Ambulatory Visit: Payer: No Typology Code available for payment source | Attending: Internal Medicine | Admitting: Internal Medicine

## 2022-04-21 ENCOUNTER — Encounter: Payer: Self-pay | Admitting: Internal Medicine

## 2022-04-21 ENCOUNTER — Encounter: Payer: Self-pay | Admitting: *Deleted

## 2022-04-21 VITALS — BP 136/84 | HR 88 | Ht 73.0 in | Wt 184.6 lb

## 2022-04-21 DIAGNOSIS — I255 Ischemic cardiomyopathy: Secondary | ICD-10-CM | POA: Diagnosis not present

## 2022-04-21 NOTE — Patient Instructions (Addendum)
Medication Instructions:  Your physician recommends that you continue on your current medications as directed. Please refer to the Current Medication list given to you today. Information about farxiga given  Labwork: none  Testing/Procedures: Your physician has requested that you have an echocardiogram in 2 weeks. Echocardiography is a painless test that uses sound waves to create images of your heart. It provides your doctor with information about the size and shape of your heart and how well your heart's chambers and valves are working. This procedure takes approximately one hour. There are no restrictions for this procedure. Please do NOT wear cologne, perfume, aftershave, or lotions (deodorant is allowed). Please arrive 15 minutes prior to your appointment time.  Follow-Up: Your physician recommends that you schedule a follow-up appointment in: 3 months  Any Other Special Instructions Will Be Listed Below (If Applicable).  If you need a refill on your cardiac medications before your next appointment, please call your pharmacy.
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
## 2022-04-27 ENCOUNTER — Encounter (HOSPITAL_COMMUNITY): Payer: No Typology Code available for payment source

## 2022-04-28 ENCOUNTER — Encounter (HOSPITAL_BASED_OUTPATIENT_CLINIC_OR_DEPARTMENT_OTHER)
Admission: RE | Admit: 2022-04-28 | Discharge: 2022-04-28 | Disposition: A | Discharge status: Home / Self Care | Payer: No Typology Code available for payment source | Source: Ambulatory Visit | Attending: Internal Medicine | Admitting: Internal Medicine

## 2022-04-28 ENCOUNTER — Telehealth: Payer: Self-pay

## 2022-04-28 ENCOUNTER — Ambulatory Visit (HOSPITAL_COMMUNITY)
Admission: RE | Admit: 2022-04-28 | Discharge: 2022-04-28 | Disposition: A | Discharge status: Home / Self Care | Payer: No Typology Code available for payment source | Source: Ambulatory Visit | Attending: Internal Medicine | Admitting: Internal Medicine

## 2022-04-28 DIAGNOSIS — I255 Ischemic cardiomyopathy: Secondary | ICD-10-CM | POA: Insufficient documentation

## 2022-04-28 LAB — NM MYOCAR MULTI W/SPECT W/WALL MOTION / EF
Angina Index: 0
Duke Treadmill Score: 3
Estimated workload: 4.6
Exercise duration (min): 3 min
Exercise duration (sec): 19 s
LV dias vol: 134 mL (ref 62–150)
LV sys vol: 68 mL
MPHR: 152 {beats}/min
Nuc Stress EF: 49 %
Peak HR: 139 {beats}/min
Percent HR: 91 %
RPE: 17
Rest HR: 79 {beats}/min
ST Depression (mm): 0 mm
TID: 0.94

## 2022-04-28 MED ORDER — REGADENOSON 0.4 MG/5ML IV SOLN
INTRAVENOUS | Status: AC
  Filled 2022-04-28: qty 5

## 2022-04-28 MED ORDER — TECHNETIUM TC 99M TETROFOSMIN IV KIT
10.6000 | PACK | Freq: Once | INTRAVENOUS | Status: AC | PRN
  Administered 2022-04-28: 10.6 via INTRAVENOUS

## 2022-04-28 MED ORDER — TECHNETIUM TC 99M TETROFOSMIN IV KIT
32.4000 | PACK | Freq: Once | INTRAVENOUS | Status: AC | PRN
  Administered 2022-04-28: 32.4 via INTRAVENOUS

## 2022-04-28 MED ORDER — SODIUM CHLORIDE FLUSH 0.9 % IV SOLN
INTRAVENOUS | Status: AC
  Administered 2022-04-28: 10 mL via INTRAVENOUS
  Filled 2022-04-28: qty 10

## 2022-04-28 NOTE — Telephone Encounter (Signed)
Pt called to advise that his weight has dropped from 212# on 04/06/2022 and is now down to 175# at Cardiologist visit. Pt is concerned because he has lost so much weight in a short period of time. Pt states he feels much better and energy is much better. He just wanted to make sure you are aware of the loss. Pt states he feels like it is fluid loss.

## 2022-05-04 ENCOUNTER — Ambulatory Visit (INDEPENDENT_AMBULATORY_CARE_PROVIDER_SITE_OTHER): Payer: No Typology Code available for payment source | Admitting: Urology

## 2022-05-04 VITALS — BP 141/88 | HR 81 | Ht 73.0 in | Wt 171.6 lb

## 2022-05-04 DIAGNOSIS — N1339 Other hydronephrosis: Secondary | ICD-10-CM | POA: Diagnosis not present

## 2022-05-04 DIAGNOSIS — N39490 Overflow incontinence: Secondary | ICD-10-CM

## 2022-05-04 DIAGNOSIS — R339 Retention of urine, unspecified: Secondary | ICD-10-CM | POA: Diagnosis not present

## 2022-05-04 DIAGNOSIS — R3121 Asymptomatic microscopic hematuria: Secondary | ICD-10-CM | POA: Diagnosis not present

## 2022-05-04 LAB — URINALYSIS, ROUTINE W REFLEX MICROSCOPIC
Bilirubin, UA: NEGATIVE
Glucose, UA: NEGATIVE
Ketones, UA: NEGATIVE
Nitrite, UA: POSITIVE — AB
Protein,UA: NEGATIVE
Specific Gravity, UA: 1.01 (ref 1.005–1.030)
Urobilinogen, Ur: 0.2 mg/dL (ref 0.2–1.0)
pH, UA: 6 (ref 5.0–7.5)

## 2022-05-04 LAB — BLADDER SCAN AMB NON-IMAGING: Scan Result: >1015

## 2022-05-04 LAB — MICROSCOPIC EXAMINATION: WBC, UA: >30 /hpf — AB (ref 0–5)

## 2022-05-04 MED ORDER — TAMSULOSIN HCL 0.4 MG PO CAPS: 0.4000 mg | ORAL_CAPSULE | Freq: Every day | ORAL | 11 refills | Status: DC

## 2022-05-04 NOTE — Progress Notes (Signed)
Pt here today for bladder scan. Bladder was scanned and >1015 was visualized.    Performed by Marisue Brooklyn, CMA
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
## 2022-05-06 LAB — URINE CULTURE

## 2022-05-08 ENCOUNTER — Ambulatory Visit (INDEPENDENT_AMBULATORY_CARE_PROVIDER_SITE_OTHER): Payer: No Typology Code available for payment source | Admitting: Family Medicine

## 2022-05-08 ENCOUNTER — Encounter: Payer: Self-pay | Admitting: Family Medicine

## 2022-05-08 VITALS — BP 120/70 | HR 88 | Temp 98.7°F | Ht 73.0 in | Wt 175.0 lb

## 2022-05-08 DIAGNOSIS — R7989 Other specified abnormal findings of blood chemistry: Secondary | ICD-10-CM

## 2022-05-08 DIAGNOSIS — E7800 Pure hypercholesterolemia, unspecified: Secondary | ICD-10-CM | POA: Diagnosis not present

## 2022-05-08 DIAGNOSIS — E871 Hypo-osmolality and hyponatremia: Secondary | ICD-10-CM | POA: Diagnosis not present

## 2022-05-08 DIAGNOSIS — I4891 Unspecified atrial fibrillation: Secondary | ICD-10-CM

## 2022-05-08 DIAGNOSIS — I2510 Atherosclerotic heart disease of native coronary artery without angina pectoris: Secondary | ICD-10-CM

## 2022-05-08 DIAGNOSIS — I509 Heart failure, unspecified: Secondary | ICD-10-CM | POA: Diagnosis not present

## 2022-05-08 MED ORDER — METOPROLOL SUCCINATE ER 25 MG PO TB24: 25.0000 mg | ORAL_TABLET | Freq: Every day | ORAL | 1 refills | Status: DC

## 2022-05-08 MED ORDER — ATORVASTATIN CALCIUM 10 MG PO TABS: ORAL_TABLET | ORAL | 1 refills | Status: DC

## 2022-05-08 NOTE — Addendum Note (Signed)
Addended by: Randal Buba K on: 05/08/2022 11:41 AM   Modules accepted: Orders
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
## 2022-05-09 LAB — BASIC METABOLIC PANEL WITH GFR
BUN/Creatinine Ratio: 16 (calc) (ref 6–22)
BUN: 27 mg/dL — ABNORMAL HIGH (ref 7–25)
CO2: 30 mmol/L (ref 20–32)
Calcium: 9.8 mg/dL (ref 8.6–10.3)
Chloride: 94 mmol/L — ABNORMAL LOW (ref 98–110)
Creat: 1.72 mg/dL — ABNORMAL HIGH (ref 0.70–1.35)
Glucose, Bld: 118 mg/dL — ABNORMAL HIGH (ref 65–99)
Potassium: 4.5 mmol/L (ref 3.5–5.3)
Sodium: 134 mmol/L — ABNORMAL LOW (ref 135–146)
eGFR: 43 mL/min/{1.73_m2} — ABNORMAL LOW (ref >=60)

## 2022-05-11 ENCOUNTER — Telehealth: Payer: Self-pay

## 2022-05-11 NOTE — Telephone Encounter (Signed)
Pt advised of normal lab results. Pt asks if he needs to stop or change his Lasix? Pt also asks if he is supposed to stop his Eliquis? Pt states he would be willing to start the Beta Blocker if you recommend it. Thank you.

## 2022-05-12 ENCOUNTER — Other Ambulatory Visit: Payer: Self-pay

## 2022-05-12 ENCOUNTER — Telehealth: Payer: Self-pay

## 2022-05-12 DIAGNOSIS — I2510 Atherosclerotic heart disease of native coronary artery without angina pectoris: Secondary | ICD-10-CM

## 2022-05-12 DIAGNOSIS — I509 Heart failure, unspecified: Secondary | ICD-10-CM

## 2022-05-12 DIAGNOSIS — I4819 Other persistent atrial fibrillation: Secondary | ICD-10-CM

## 2022-05-12 DIAGNOSIS — I10 Essential (primary) hypertension: Secondary | ICD-10-CM

## 2022-05-12 MED ORDER — FUROSEMIDE 20 MG PO TABS: 20.0000 mg | ORAL_TABLET | Freq: Every day | ORAL | 3 refills | Status: DC

## 2022-05-12 MED ORDER — APIXABAN 5 MG PO TABS: 5.0000 mg | ORAL_TABLET | Freq: Two times a day (BID) | ORAL | 3 refills | Status: DC

## 2022-05-12 MED ORDER — LOSARTAN POTASSIUM 50 MG PO TABS: 50.0000 mg | ORAL_TABLET | Freq: Every day | ORAL | 3 refills | Status: DC

## 2022-05-12 NOTE — Telephone Encounter (Signed)
Pt called wanted to know what did you find out about beta blockers? Pt asked if you can call pt back?

## 2022-05-13 ENCOUNTER — Encounter (HOSPITAL_COMMUNITY): Payer: No Typology Code available for payment source

## 2022-05-19 ENCOUNTER — Encounter (HOSPITAL_COMMUNITY): Payer: No Typology Code available for payment source

## 2022-06-15 ENCOUNTER — Other Ambulatory Visit: Payer: No Typology Code available for payment source | Admitting: Urology

## 2022-06-24 ENCOUNTER — Ambulatory Visit (HOSPITAL_COMMUNITY)
Admission: RE | Admit: 2022-06-24 | Discharge: 2022-06-24 | Disposition: A | Discharge status: Home / Self Care | Payer: No Typology Code available for payment source | Source: Ambulatory Visit | Attending: Urology | Admitting: Urology

## 2022-06-24 DIAGNOSIS — N1339 Other hydronephrosis: Secondary | ICD-10-CM

## 2022-06-24 DIAGNOSIS — I700 Atherosclerosis of aorta: Secondary | ICD-10-CM | POA: Diagnosis not present

## 2022-06-24 DIAGNOSIS — N132 Hydronephrosis with renal and ureteral calculous obstruction: Secondary | ICD-10-CM | POA: Diagnosis not present

## 2022-06-29 ENCOUNTER — Ambulatory Visit (INDEPENDENT_AMBULATORY_CARE_PROVIDER_SITE_OTHER): Payer: No Typology Code available for payment source | Admitting: Urology

## 2022-06-29 VITALS — BP 147/94 | HR 73

## 2022-06-29 DIAGNOSIS — N1832 Chronic kidney disease, stage 3b: Secondary | ICD-10-CM

## 2022-06-29 DIAGNOSIS — N1339 Other hydronephrosis: Secondary | ICD-10-CM

## 2022-06-29 DIAGNOSIS — N39490 Overflow incontinence: Secondary | ICD-10-CM

## 2022-06-29 DIAGNOSIS — R339 Retention of urine, unspecified: Secondary | ICD-10-CM

## 2022-06-29 LAB — BLADDER SCAN AMB NON-IMAGING: Scan Result: >880

## 2022-06-29 LAB — MICROSCOPIC EXAMINATION

## 2022-06-29 LAB — URINALYSIS, ROUTINE W REFLEX MICROSCOPIC
Bilirubin, UA: NEGATIVE
Glucose, UA: NEGATIVE
Ketones, UA: NEGATIVE
Nitrite, UA: POSITIVE — AB
Protein,UA: NEGATIVE
Specific Gravity, UA: <1.005 — ABNORMAL LOW (ref 1.005–1.030)
Urobilinogen, Ur: 0.2 mg/dL (ref 0.2–1.0)
pH, UA: 6 (ref 5.0–7.5)

## 2022-06-29 MED ORDER — CIPROFLOXACIN HCL 500 MG PO TABS
500.0000 mg | ORAL_TABLET | Freq: Once | ORAL | Status: AC
  Administered 2022-06-29: 500 mg via ORAL

## 2022-06-29 NOTE — Addendum Note (Signed)
Addended by: Christoper Fabian R on: 06/29/2022 02:50 PM   Modules accepted: Orders

## 2022-06-29 NOTE — Progress Notes (Addendum)
  [place] 06/29/22  CC: No chief complaint on file.   HPI:  1) hydronephrosis - pt with Cr rising from 0.7 in 2019 to 1.6 in 2024 with a gfr 47 (3a). A Jan 2024 [place] revlealed R > L hydronephrosis and a distended bladder with 1300 ml. He did not void. He was admitted at the time for CHF exacerbation and was undergoing IV diuresis. He is 40 lbs lighter.    2) overflow incontinence, incomplete bladder emptying - he has noc x 3. Urgency when he gets up at night and he has incontinence at night. He's been wearing diapers. His bladder is distended and when he pushes on it he feels the need to urinate. AUASS = 7. He voids with a good stream. He feels empty (no pain). No h/o BPH. No NG risk.    Bladder scan 1015 ml. PSA was 2.1 in 2020, 2.8 in 2022 and 3.6 in 2024.    3) microhematuria - no gross hematuria. 3-10 rbc on UA Feb 2024.    Today, seen for the above and management of chronic kidney disease hydronephrosis, overflow incontinence as well as cystoscopy for microscopic hematuria.  He underwent CT scan of the abdomen and pelvis.   A February 2024 creatinine was 1.7.  A April 2024 CT revealed bilateral hydroureteronephrosis down to a distended bladder.  Prostate measured about 30 g.   He is on apixaban. He retired from [employer].      Blood pressure (!) 147/94, pulse 73. NED. A&Ox3.   No respiratory distress   Abd soft, NT, ND Normal phallus with bilateral descended testicles  Cystoscopy Procedure Note  Patient identification was confirmed, informed consent was obtained, and patient was prepped using Betadine solution.  Lidocaine jelly was administered per urethral meatus.     Pre-Procedure: - Inspection reveals a normal caliber ureteral meatus.  Procedure: The flexible cystoscope was introduced without difficulty - No urethral strictures/lesions are present. - mild hyperplasia borderline prostate obstruction  - normal bladder neck - Bilateral ureteral orifices identified -  Bladder mucosa  reveals no ulcers, tumors, or lesions - No bladder stones - No trabeculation  Retroflexion shows normal bladder   Post-Procedure: - Patient tolerated the procedure well  Assessment/ Plan:  Overflow incontinence with urinary retention causing hydronephrosis and chronic kidney disease-again I stressed to the patient the damage he is inflicting on his kidneys and that this will lead to end-stage renal disease on hemodialysis.  I recommended he learn CIC or place a Foley catheter.  Probably the best thing to do would be to place a Foley and give his system time to decompress for a week or 2 and then learn CIC. I also recommended UDS. All questions answered. After careful consideration, he will learn CIC but did not want to do any testing or consider surgery.   He was taught CIC and I recommended he do that BID to start.   Microscopic hematuria-benign evaluation   No follow-ups on file.  Jerilee Field, MD

## 2022-06-29 NOTE — Progress Notes (Signed)
Continuous Intermittent Catheterization  Due to urinary retention patient is present today for a teaching of self I & O Catheterization. Patient was given detailed verbal and printed instructions of self catheterization. Patient was cleaned and prepped in a sterile fashion.  With instruction and assistance patient inserted a 16FR and urine return was noted 300 ml, urine was yellow in color. Patient tolerated well, no complications were noted Patient was given a sample bag with supplies to take home.  Instructions were given per Dr. Mena Goes for patient to cath 2 times daily.  An order was placed with [facility] for catheters to be sent to the patient's home. Patient is to follow up in 1 month with Dr. Mena Goes.  Performed by: Guss Bunde, CMA

## 2022-06-29 NOTE — Addendum Note (Signed)
Addended by: Christoper Fabian R on: 06/29/2022 02:42 PM   Modules accepted: Orders

## 2022-07-01 ENCOUNTER — Ambulatory Visit: Payer: No Typology Code available for payment source

## 2022-07-01 LAB — URINE CULTURE

## 2022-07-02 ENCOUNTER — Ambulatory Visit: Payer: No Typology Code available for payment source

## 2022-07-02 DIAGNOSIS — R339 Retention of urine, unspecified: Secondary | ICD-10-CM | POA: Diagnosis not present

## 2022-07-02 NOTE — Progress Notes (Signed)
Patient in office with concerns about CIC. Patient states he has not been able to get any urine out only some blood since leaving the office.    Continuous Intermittent Catheterization  Due to urinary retention patient is present today for a teaching of self I & O Catheterization. Patient was given detailed verbal and printed instructions of self catheterization. Patient was cleaned and prepped in a sterile fashion.  With instruction and assistance patient inserted a 16FR and urine return was noted 1300 ml, urine was yellow in color. Patient tolerated well, no complications were noted  Patient noted to have an easier time inserting catheter while reclined back in chair instead of in a sitting position.  Performed by: Keasia Dubose LPN  Additional Notes: Keep scheduled f/u.

## 2022-07-02 NOTE — Patient Instructions (Signed)

## 2022-07-06 ENCOUNTER — Telehealth: Payer: Self-pay

## 2022-07-06 NOTE — Telephone Encounter (Signed)
Patient called today with concern about seeing a little blood when he self catheter.  Made patient aware to increase fluids which should clear the blood. Patient voiced understanding.

## 2022-07-06 NOTE — Telephone Encounter (Signed)
Patient called back making me aware that he is still seeing blood when he self catheter. Made patient aware that I scheduled him to come in to see MD. Patient voiced understanding

## 2022-07-07 ENCOUNTER — Ambulatory Visit: Payer: No Typology Code available for payment source | Admitting: Urology

## 2022-07-16 ENCOUNTER — Telehealth: Payer: Self-pay

## 2022-07-16 NOTE — Telephone Encounter (Signed)
I spoke with Cory Valentine at concerning foley catheter supplies. Patient will use catheter supplies indefinitely.

## 2022-07-20 ENCOUNTER — Ambulatory Visit: Payer: No Typology Code available for payment source | Attending: Internal Medicine | Admitting: Internal Medicine

## 2022-07-20 ENCOUNTER — Encounter: Payer: Self-pay | Admitting: Internal Medicine

## 2022-07-20 VITALS — BP 138/70 | HR 78 | Ht 73.0 in | Wt 177.2 lb

## 2022-07-20 DIAGNOSIS — I10 Essential (primary) hypertension: Secondary | ICD-10-CM

## 2022-07-20 DIAGNOSIS — I4819 Other persistent atrial fibrillation: Secondary | ICD-10-CM

## 2022-07-20 DIAGNOSIS — I509 Heart failure, unspecified: Secondary | ICD-10-CM

## 2022-07-20 MED ORDER — LOSARTAN POTASSIUM 50 MG PO TABS: 50.0000 mg | ORAL_TABLET | Freq: Every day | ORAL | 3 refills | Status: DC

## 2022-07-20 MED ORDER — FUROSEMIDE 20 MG PO TABS: 20.0000 mg | ORAL_TABLET | Freq: Every day | ORAL | 3 refills | Status: DC
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
## 2022-07-20 NOTE — Patient Instructions (Addendum)

## 2022-07-27 ENCOUNTER — Ambulatory Visit (INDEPENDENT_AMBULATORY_CARE_PROVIDER_SITE_OTHER): Payer: No Typology Code available for payment source | Admitting: Urology

## 2022-07-27 ENCOUNTER — Encounter: Payer: Self-pay | Admitting: Urology

## 2022-07-27 VITALS — BP 134/69 | HR 96

## 2022-07-27 DIAGNOSIS — N39490 Overflow incontinence: Secondary | ICD-10-CM | POA: Diagnosis not present

## 2022-07-27 DIAGNOSIS — R972 Elevated prostate specific antigen [PSA]: Secondary | ICD-10-CM

## 2022-07-27 DIAGNOSIS — N1339 Other hydronephrosis: Secondary | ICD-10-CM | POA: Diagnosis not present

## 2022-07-27 DIAGNOSIS — R339 Retention of urine, unspecified: Secondary | ICD-10-CM
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
## 2022-07-28 LAB — CREATININE, SERUM
Creatinine, Ser: 1.5 mg/dL — ABNORMAL HIGH (ref 0.76–1.27)
eGFR: 50 mL/min/{1.73_m2} — ABNORMAL LOW (ref >59)

## 2022-07-28 LAB — PSA: Prostate Specific Ag, Serum: 3.8 ng/mL (ref 0.0–4.0)

## 2022-07-30 DIAGNOSIS — R339 Retention of urine, unspecified: Secondary | ICD-10-CM | POA: Diagnosis not present

## 2022-08-03 ENCOUNTER — Telehealth: Payer: Self-pay

## 2022-08-03 NOTE — Telephone Encounter (Signed)
Patient is made aware of Dr. Mena Goes recommendation, patient states he scheduled to have Renal US August 18, 2022 and wants to know if he need to continued with the Renal US apt. Patient is aware that a message will be sent to Dr. Mena Goes and some will reach back out with Dr. Mena Goes recommendation. Patient voiced understanding.

## 2022-08-03 NOTE — Telephone Encounter (Signed)
-----   Message from Jerilee Field, MD sent at 08/03/2022  4:31 PM EDT ----- Let Alysia Penna know his kidney function is a little bit better and at least stable. Cont CIC. His PSA is normal.   ----- Message ----- From: Troy Sine, CMA Sent: 07/30/2022   4:22 PM EDT To: Jerilee Field, MD  Please review

## 2022-08-04 NOTE — Telephone Encounter (Signed)
Follow up call to patient leaving a detail message with Dr. Mena Goes recommendation, "Yes, absolutely keep renal US appt. Also make sure he caths/empties his bladder before the renal US. We want to make sure the pressure has been taken off his kidneys. Thanks."

## 2022-08-18 ENCOUNTER — Ambulatory Visit (HOSPITAL_COMMUNITY)
Admission: RE | Admit: 2022-08-18 | Discharge: 2022-08-18 | Disposition: A | Discharge status: Home / Self Care | Payer: No Typology Code available for payment source | Source: Ambulatory Visit | Attending: Urology | Admitting: Urology

## 2022-08-18 DIAGNOSIS — N1339 Other hydronephrosis: Secondary | ICD-10-CM | POA: Diagnosis not present

## 2022-08-18 DIAGNOSIS — N1330 Unspecified hydronephrosis: Secondary | ICD-10-CM | POA: Diagnosis not present

## 2022-08-26 ENCOUNTER — Telehealth: Payer: Self-pay

## 2022-08-26 NOTE — Telephone Encounter (Signed)
Left detailed message making patient aware of MD recommendation.

## 2022-08-26 NOTE — Telephone Encounter (Signed)
-----   Message from Jerilee Field, MD sent at 08/26/2022  2:46 PM EDT ----- Let Alysia Penna know his kidney US looks great. Much less pressure on his kidneys. He is doing a good job with CIC. He should continue.   ----- Message ----- From: Troy Sine, CMA Sent: 08/19/2022   8:47 AM EDT To: Jerilee Field, MD  Please review

## 2022-08-27 ENCOUNTER — Telehealth: Payer: Self-pay

## 2022-08-27 NOTE — Telephone Encounter (Signed)
Patient called advising he received his results via MyChart and wanted to ensure that there were not concerns.     Thank you

## 2022-08-28 DIAGNOSIS — R339 Retention of urine, unspecified: Secondary | ICD-10-CM | POA: Diagnosis not present

## 2022-09-29 DIAGNOSIS — R339 Retention of urine, unspecified: Secondary | ICD-10-CM | POA: Diagnosis not present

## 2022-10-28 DIAGNOSIS — R339 Retention of urine, unspecified: Secondary | ICD-10-CM | POA: Diagnosis not present

## 2022-11-02 ENCOUNTER — Other Ambulatory Visit: Payer: Self-pay | Admitting: Family Medicine

## 2022-11-02 DIAGNOSIS — I10 Essential (primary) hypertension: Secondary | ICD-10-CM

## 2022-11-02 DIAGNOSIS — I2510 Atherosclerotic heart disease of native coronary artery without angina pectoris: Secondary | ICD-10-CM

## 2022-11-03 NOTE — Telephone Encounter (Signed)
No longer current dosing Requested Prescriptions  Pending Prescriptions Disp Refills   losartan (COZAAR) 100 MG tablet [Pharmacy Med Name: Losartan Potassium 100 MG Oral Tablet] 90 tablet 0    Sig: Take 1 tablet by mouth once daily     Cardiovascular:  Angiotensin Receptor Blockers Failed - 11/02/2022  6:53 AM      Failed - Cr in normal range and within 180 days    Creat  Date Value Ref Range Status  05/08/2022 1.72 (H) 0.70 - 1.35 mg/dL Final   Creatinine, Ser  Date Value Ref Range Status  07/27/2022 1.50 (H) 0.76 - 1.27 mg/dL Final   Creatinine, Urine  Date Value Ref Range Status  03/06/2022 51 20 - 320 mg/dL Final         Failed - Valid encounter within last 6 months    Recent Outpatient Visits           1 year ago Pneumonia of left lower lobe due to infectious organism   [facility] Pickard, Priscille Heidelberg, MD   2 years ago Hyponatremia   [facility] Tanya Nones, Priscille Heidelberg, MD   2 years ago Essential hypertension   [facility] Valentino Nose, NP   3 years ago Prostate cancer screening   [facility] Tanya Nones, Priscille Heidelberg, MD   5 years ago Prostate cancer screening   [facility] Donita Brooks, MD       Future Appointments             In 8 months McKenzie, Mardene Celeste, MD [facility] [place]            Passed - K in normal range and within 180 days    Potassium  Date Value Ref Range Status  05/08/2022 4.5 3.5 - 5.3 mmol/L Final         Passed - Patient is not pregnant      Passed - Last BP in normal range    BP Readings from Last 1 Encounters:  07/27/22 134/69

## 2022-11-04 ENCOUNTER — Other Ambulatory Visit: Payer: Self-pay | Admitting: Family Medicine

## 2022-11-04 DIAGNOSIS — I10 Essential (primary) hypertension: Secondary | ICD-10-CM

## 2022-11-04 DIAGNOSIS — I2510 Atherosclerotic heart disease of native coronary artery without angina pectoris: Secondary | ICD-10-CM

## 2022-11-05 NOTE — Telephone Encounter (Signed)
Requested Prescriptions  Pending Prescriptions Disp Refills   losartan (COZAAR) 100 MG tablet [Pharmacy Med Name: Losartan Potassium 100 MG Oral Tablet] 90 tablet 0    Sig: Take 1 tablet by mouth once daily     Cardiovascular:  Angiotensin Receptor Blockers Failed - 11/04/2022 10:29 AM      Failed - Cr in normal range and within 180 days    Creat  Date Value Ref Range Status  05/08/2022 1.72 (H) 0.70 - 1.35 mg/dL Final   Creatinine, Ser  Date Value Ref Range Status  07/27/2022 1.50 (H) 0.76 - 1.27 mg/dL Final   Creatinine, Urine  Date Value Ref Range Status  03/06/2022 51 20 - 320 mg/dL Final         Failed - Valid encounter within last 6 months    Recent Outpatient Visits           1 year ago Pneumonia of left lower lobe due to infectious organism   [facility] Pickard, Priscille Heidelberg, MD   2 years ago Hyponatremia   [facility] Tanya Nones, Priscille Heidelberg, MD   2 years ago Essential hypertension   [facility] Valentino Nose, NP   3 years ago Prostate cancer screening   [facility] Medicine Tanya Nones, Priscille Heidelberg, MD   5 years ago Prostate cancer screening   [facility] Medicine Donita Brooks, MD       Future Appointments             In 8 months McKenzie, Mardene Celeste, MD [facility] [place]            Passed - K in normal range and within 180 days    Potassium  Date Value Ref Range Status  05/08/2022 4.5 3.5 - 5.3 mmol/L Final         Passed - Patient is not pregnant      Passed - Last BP in normal range    BP Readings from Last 1 Encounters:  07/27/22 134/69

## 2022-11-09 NOTE — Telephone Encounter (Signed)
Patient called to follow up on refill requested for losartan (COZAAR) 100 MG tablet [Pharmacy Med Name: Losartan Potassium 100 MG Oral Tablet]   Pharmacy confirmed as:  Psychologist, forensic [address] [place] - [address], [place] [address] Phone: [phone]  Fax: [phone]   Please advise patient at [phone].

## 2022-11-09 NOTE — Telephone Encounter (Signed)
Patient called back to also request refill of   metoprolol succinate (TOPROL-XL) 25 MG 24 hr tablet   Please see previous messages in thread.

## 2022-11-12 ENCOUNTER — Other Ambulatory Visit: Payer: Self-pay

## 2022-11-12 DIAGNOSIS — I4891 Unspecified atrial fibrillation: Secondary | ICD-10-CM

## 2022-11-12 DIAGNOSIS — I509 Heart failure, unspecified: Secondary | ICD-10-CM

## 2022-11-12 DIAGNOSIS — I10 Essential (primary) hypertension: Secondary | ICD-10-CM

## 2022-11-12 MED ORDER — LOSARTAN POTASSIUM 50 MG PO TABS: 50.0000 mg | ORAL_TABLET | Freq: Every day | ORAL | 0 refills | Status: DC

## 2022-11-12 MED ORDER — METOPROLOL SUCCINATE ER 25 MG PO TB24: 25.0000 mg | ORAL_TABLET | Freq: Every day | ORAL | 0 refills | Status: DC

## 2022-11-20 ENCOUNTER — Telehealth: Payer: Self-pay

## 2022-11-20 NOTE — Telephone Encounter (Signed)
Approval from [facility] Plan: A 4351 Straight Tip Urine Catheter. Authorization number [id number].   Patient made aware and voiced understanding.

## 2022-11-20 NOTE — Telephone Encounter (Signed)
-----   Message from Newton H sent at 11/20/2022  2:33 PM EDT ----- Please review.

## 2022-11-26 DIAGNOSIS — R339 Retention of urine, unspecified: Secondary | ICD-10-CM | POA: Diagnosis not present

## 2022-12-04 ENCOUNTER — Telehealth: Payer: Self-pay

## 2022-12-04 DIAGNOSIS — E7800 Pure hypercholesterolemia, unspecified: Secondary | ICD-10-CM

## 2022-12-04 DIAGNOSIS — I2510 Atherosclerotic heart disease of native coronary artery without angina pectoris: Secondary | ICD-10-CM

## 2022-12-04 NOTE — Telephone Encounter (Signed)
Prescription Request  12/04/2022  LOV:05/08/22  What is the name of the medication or equipment? atorvastatin (LIPITOR) 10 MG tablet [[id number]]  Have you contacted your pharmacy to request a refill? Yes   Which pharmacy would you like this sent to?  [facility] [phone]  Patient notified that their request is being sent to the clinical staff for review and that they should receive a response within 2 business days.   Please advise at [facility] [phone]

## 2022-12-16 ENCOUNTER — Other Ambulatory Visit: Payer: Self-pay | Admitting: Family Medicine

## 2022-12-16 DIAGNOSIS — E7800 Pure hypercholesterolemia, unspecified: Secondary | ICD-10-CM

## 2022-12-16 DIAGNOSIS — I2510 Atherosclerotic heart disease of native coronary artery without angina pectoris: Secondary | ICD-10-CM

## 2022-12-17 NOTE — Telephone Encounter (Signed)
Requested Prescriptions  Pending Prescriptions Disp Refills   atorvastatin (LIPITOR) 10 MG tablet [Pharmacy Med Name: Atorvastatin Calcium 10 MG Oral Tablet] 90 tablet 0    Sig: Take 1 tablet by mouth once daily     Cardiovascular:  Antilipid - Statins Failed - 12/16/2022  2:25 PM      Failed - Valid encounter within last 12 months    Recent Outpatient Visits           1 year ago Pneumonia of left lower lobe due to infectious organism   [facility] Pickard, Priscille Heidelberg, MD   2 years ago Hyponatremia   [facility] Medicine Pickard, Priscille Heidelberg, MD   2 years ago Essential hypertension   [facility] Valentino Nose, NP   3 years ago Prostate cancer screening   [facility] Medicine Tanya Nones, Priscille Heidelberg, MD   5 years ago Prostate cancer screening   [facility] Pickard, Priscille Heidelberg, MD       Future Appointments             In 2 months Pickard, Priscille Heidelberg, MD  [facility], [facility]   In 7 months McKenzie, Mardene Celeste, MD [facility] [place]            Failed - Lipid Panel in normal range within the last 12 months    Cholesterol, Total  Date Value Ref Range Status  06/25/2017 144 100 - 199 mg/dL Final   Cholesterol  Date Value Ref Range Status  02/26/2022 90 <200 mg/dL Final   LDL Cholesterol (Calc)  Date Value Ref Range Status  02/26/2022 36 mg/dL (calc) Final    Comment:    Reference range: <100 . Desirable range <100 mg/dL for primary prevention;   <70 mg/dL for patients with CHD or diabetic patients  with > or = 2 CHD risk factors. Marland Kitchen LDL-C is now calculated using the Martin-Hopkins  calculation, which is a validated novel method providing  better accuracy than the Friedewald equation in the  estimation of LDL-C.  Horald Pollen et al. Lenox Ahr. 1610;960(45): 2061-2068  (http://education.[employer].com/faq/FAQ164)    HDL  Date Value Ref Range Status  02/26/2022 41 > OR = 40  mg/dL Final  40/98/1191 74 >47 mg/dL Final   Triglycerides  Date Value Ref Range Status  02/26/2022 45 <150 mg/dL Final         Passed - Patient is not pregnant

## 2022-12-28 DIAGNOSIS — R339 Retention of urine, unspecified: Secondary | ICD-10-CM | POA: Diagnosis not present

## 2023-01-16 ENCOUNTER — Other Ambulatory Visit: Payer: Self-pay | Admitting: Family Medicine

## 2023-01-16 DIAGNOSIS — I10 Essential (primary) hypertension: Secondary | ICD-10-CM

## 2023-01-16 DIAGNOSIS — I509 Heart failure, unspecified: Secondary | ICD-10-CM

## 2023-01-18 NOTE — Telephone Encounter (Signed)
Requested medication (s) are due for refill today: yes  Requested medication (s) are on the active medication list: yes  Last refill:  11/12/22  Future visit scheduled: yes  Notes to clinic:  over due lab work and lab outside normal range    Requested Prescriptions  Pending Prescriptions Disp Refills   losartan (COZAAR) 50 MG tablet [Pharmacy Med Name: Losartan Potassium 50 MG Oral Tablet] 90 tablet 0    Sig: Take 1 tablet by mouth once daily     Cardiovascular:  Angiotensin Receptor Blockers Failed - 01/16/2023 10:35 AM      Failed - Cr in normal range and within 180 days    Creat  Date Value Ref Range Status  05/08/2022 1.72 (H) 0.70 - 1.35 mg/dL Final   Creatinine, Ser  Date Value Ref Range Status  07/27/2022 1.50 (H) 0.76 - 1.27 mg/dL Final   Creatinine, Urine  Date Value Ref Range Status  03/06/2022 51 20 - 320 mg/dL Final         Failed - K in normal range and within 180 days    Potassium  Date Value Ref Range Status  05/08/2022 4.5 3.5 - 5.3 mmol/L Final         Failed - Valid encounter within last 6 months    Recent Outpatient Visits           2 years ago Pneumonia of left lower lobe due to infectious organism   [facility] Medicine Donita Brooks, MD   2 years ago Hyponatremia   [facility] Medicine Donita Brooks, MD   2 years ago Essential hypertension   [facility] Medicine Valentino Nose, NP   3 years ago Prostate cancer screening   [facility] Medicine Tanya Nones Priscille Heidelberg, MD   5 years ago Prostate cancer screening   [facility] Medicine Pickard, Priscille Heidelberg, MD       Future Appointments             In 1 month Pickard, Priscille Heidelberg, MD [facility], [facility]   In 6 months Jerilee Field, MD [facility] [place] [facility] - Patient is not pregnant      Passed - Last BP in normal range    BP Readings from Last 1 Encounters:  07/27/22 134/69

## 2023-01-27 DIAGNOSIS — R339 Retention of urine, unspecified: Secondary | ICD-10-CM | POA: Diagnosis not present

## 2023-02-01 DIAGNOSIS — I129 Hypertensive chronic kidney disease with stage 1 through stage 4 chronic kidney disease, or unspecified chronic kidney disease: Secondary | ICD-10-CM | POA: Diagnosis not present

## 2023-02-01 DIAGNOSIS — N1831 Chronic kidney disease, stage 3a: Secondary | ICD-10-CM | POA: Diagnosis not present

## 2023-02-01 DIAGNOSIS — Z008 Encounter for other general examination: Secondary | ICD-10-CM | POA: Diagnosis not present

## 2023-02-01 DIAGNOSIS — Z7901 Long term (current) use of anticoagulants: Secondary | ICD-10-CM | POA: Diagnosis not present

## 2023-02-01 DIAGNOSIS — Z6822 Body mass index (BMI) 22.0-22.9, adult: Secondary | ICD-10-CM | POA: Diagnosis not present

## 2023-02-01 DIAGNOSIS — E785 Hyperlipidemia, unspecified: Secondary | ICD-10-CM | POA: Diagnosis not present

## 2023-02-01 DIAGNOSIS — I4891 Unspecified atrial fibrillation: Secondary | ICD-10-CM | POA: Diagnosis not present

## 2023-02-01 DIAGNOSIS — F17211 Nicotine dependence, cigarettes, in remission: Secondary | ICD-10-CM | POA: Diagnosis not present

## 2023-02-01 DIAGNOSIS — I700 Atherosclerosis of aorta: Secondary | ICD-10-CM | POA: Diagnosis not present

## 2023-02-01 DIAGNOSIS — D6869 Other thrombophilia: Secondary | ICD-10-CM | POA: Diagnosis not present

## 2023-02-04 NOTE — Telephone Encounter (Signed)
Copied from CRM [id number]. Topic: Clinical - Medication Refill >> Feb 04, 2023  8:46 AM Clayton Bibles wrote: Most Recent Primary Care Visit:  Provider: Lynnea Ferrier T  Department[facility]  Visit Type: HOSPITAL FU  Date: 05/08/2022  Medication: Losartan and Metoprolol  Has the patient contacted their pharmacy? Yes (Agent: If no, request that the patient contact the pharmacy for the refill. If patient does not wish to contact the pharmacy document the reason why and proceed with request.) (Agent: If yes, when and what did the pharmacy advise?)  Is this the correct pharmacy for this prescription? Yes If no, delete pharmacy and type the correct one.  This is the patient's preferred pharmacy:  [facility] [address] [place] - [address] [place] [address] [place] [address] Phone: [phone] Fax: [phone]  [facility] [address] [place] [address] Phone: [phone] Fax: [phone]   Has the prescription been filled recently? No  Is the patient out of the medication? No - 02/08/23 will be his last dose  Has the patient been seen for an appointment in the last year OR does the patient have an upcoming appointment? Yes  Can we respond through MyChart? Yes  Agent: Please be advised that Rx refills may take up to 3 business days. We ask that you follow-up with your pharmacy.

## 2023-02-05 ENCOUNTER — Other Ambulatory Visit: Payer: Self-pay | Admitting: Family Medicine

## 2023-02-05 DIAGNOSIS — I509 Heart failure, unspecified: Secondary | ICD-10-CM

## 2023-02-05 DIAGNOSIS — I4891 Unspecified atrial fibrillation: Secondary | ICD-10-CM

## 2023-02-08 NOTE — Telephone Encounter (Signed)
Requested Prescriptions  Pending Prescriptions Disp Refills   metoprolol succinate (TOPROL-XL) 25 MG 24 hr tablet [Pharmacy Med Name: Metoprolol Succinate ER 25 MG Oral Tablet Extended Release 24 Hour] 90 tablet 0    Sig: Take 1 tablet by mouth once daily     Cardiovascular:  Beta Blockers Failed - 02/05/2023 11:40 AM      Failed - Valid encounter within last 6 months    Recent Outpatient Visits           2 years ago Pneumonia of left lower lobe due to infectious organism   [facility] Pickard, Priscille Heidelberg, MD   2 years ago Hyponatremia   [facility] Medicine Tanya Nones, Priscille Heidelberg, MD   2 years ago Essential hypertension   [facility] Medicine Valentino Nose, NP   3 years ago Prostate cancer screening   [facility] Medicine Tanya Nones Priscille Heidelberg, MD   5 years ago Prostate cancer screening   [facility] Pickard, Priscille Heidelberg, MD       Future Appointments             In 4 weeks Pickard, Priscille Heidelberg, MD [facility], [facility]   In 5 months Jerilee Field, MD [facility] [place]            Passed - Last BP in normal range    BP Readings from Last 1 Encounters:  07/27/22 134/69         Passed - Last Heart Rate in normal range    Pulse Readings from Last 1 Encounters:  07/27/22 96

## 2023-02-26 DIAGNOSIS — R339 Retention of urine, unspecified: Secondary | ICD-10-CM | POA: Diagnosis not present

## 2023-03-05 DIAGNOSIS — H524 Presbyopia: Secondary | ICD-10-CM | POA: Diagnosis not present

## 2023-03-08 ENCOUNTER — Ambulatory Visit (INDEPENDENT_AMBULATORY_CARE_PROVIDER_SITE_OTHER): Payer: No Typology Code available for payment source | Admitting: Family Medicine

## 2023-03-08 ENCOUNTER — Encounter: Payer: Self-pay | Admitting: Family Medicine

## 2023-03-08 VITALS — BP 124/78 | HR 77 | Temp 97.9°F | Ht 73.0 in | Wt 177.0 lb

## 2023-03-08 DIAGNOSIS — I10 Essential (primary) hypertension: Secondary | ICD-10-CM

## 2023-03-08 DIAGNOSIS — Z0001 Encounter for general adult medical examination with abnormal findings: Secondary | ICD-10-CM | POA: Diagnosis not present

## 2023-03-08 DIAGNOSIS — I4891 Unspecified atrial fibrillation: Secondary | ICD-10-CM

## 2023-03-08 DIAGNOSIS — I2510 Atherosclerotic heart disease of native coronary artery without angina pectoris: Secondary | ICD-10-CM

## 2023-03-08 DIAGNOSIS — Z1211 Encounter for screening for malignant neoplasm of colon: Secondary | ICD-10-CM

## 2023-03-08 DIAGNOSIS — I509 Heart failure, unspecified: Secondary | ICD-10-CM | POA: Diagnosis not present

## 2023-03-08 DIAGNOSIS — Z0000 Encounter for general adult medical examination without abnormal findings: Secondary | ICD-10-CM

## 2023-03-08 MED ORDER — ALBUTEROL SULFATE HFA 108 (90 BASE) MCG/ACT IN AERS: 2.0000 | INHALATION_SPRAY | Freq: Four times a day (QID) | RESPIRATORY_TRACT | 0 refills | Status: DC | PRN
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
## 2023-03-09 LAB — COMPLETE METABOLIC PANEL WITH GFR
AG Ratio: 1.5 (calc) (ref 1.0–2.5)
ALT: 18 U/L (ref 9–46)
AST: 32 U/L (ref 10–35)
Albumin: 4.6 g/dL (ref 3.6–5.1)
Alkaline phosphatase (APISO): 113 U/L (ref 35–144)
BUN/Creatinine Ratio: 19 (calc) (ref 6–22)
BUN: 32 mg/dL — ABNORMAL HIGH (ref 7–25)
CO2: 27 mmol/L (ref 20–32)
Calcium: 9 mg/dL (ref 8.6–10.3)
Chloride: 90 mmol/L — ABNORMAL LOW (ref 98–110)
Creat: 1.68 mg/dL — ABNORMAL HIGH (ref 0.70–1.35)
Globulin: 3.1 g/dL (ref 1.9–3.7)
Glucose, Bld: 102 mg/dL — ABNORMAL HIGH (ref 65–99)
Potassium: 4 mmol/L (ref 3.5–5.3)
Sodium: 129 mmol/L — ABNORMAL LOW (ref 135–146)
Total Bilirubin: 0.5 mg/dL (ref 0.2–1.2)
Total Protein: 7.7 g/dL (ref 6.1–8.1)
eGFR: 44 mL/min/{1.73_m2} — ABNORMAL LOW (ref >=60)

## 2023-03-09 LAB — CBC WITH DIFFERENTIAL/PLATELET
Absolute Lymphocytes: 1697 {cells}/uL (ref 850–3900)
Absolute Monocytes: 465 {cells}/uL (ref 200–950)
Basophils Absolute: 62 {cells}/uL (ref 0–200)
Basophils Relative: 1.1 %
Eosinophils Absolute: 151 {cells}/uL (ref 15–500)
Eosinophils Relative: 2.7 %
HCT: 44 % (ref 38.5–50.0)
Hemoglobin: 14.7 g/dL (ref 13.2–17.1)
MCH: 33.3 pg — ABNORMAL HIGH (ref 27.0–33.0)
MCHC: 33.4 g/dL (ref 32.0–36.0)
MCV: 99.8 fL (ref 80.0–100.0)
MPV: 9.3 fL (ref 7.5–12.5)
Monocytes Relative: 8.3 %
Neutro Abs: 3226 {cells}/uL (ref 1500–7800)
Neutrophils Relative %: 57.6 %
Platelets: 265 10*3/uL (ref 140–400)
RBC: 4.41 10*6/uL (ref 4.20–5.80)
RDW: 12.4 % (ref 11.0–15.0)
Total Lymphocyte: 30.3 %
WBC: 5.6 10*3/uL (ref 3.8–10.8)

## 2023-03-09 LAB — LIPID PANEL
Cholesterol: 126 mg/dL (ref <200)
HDL: 65 mg/dL (ref >=40)
LDL Cholesterol (Calc): 48 mg/dL
Non-HDL Cholesterol (Calc): 61 mg/dL (ref <130)
Total CHOL/HDL Ratio: 1.9 (calc) (ref <5.0)
Triglycerides: 51 mg/dL (ref <150)

## 2023-03-12 ENCOUNTER — Other Ambulatory Visit: Payer: Self-pay

## 2023-03-12 DIAGNOSIS — I2510 Atherosclerotic heart disease of native coronary artery without angina pectoris: Secondary | ICD-10-CM

## 2023-03-16 DIAGNOSIS — Z1211 Encounter for screening for malignant neoplasm of colon: Secondary | ICD-10-CM | POA: Diagnosis not present

## 2023-03-23 LAB — COLOGUARD: COLOGUARD: NEGATIVE

## 2023-03-24 DIAGNOSIS — L570 Actinic keratosis: Secondary | ICD-10-CM | POA: Diagnosis not present

## 2023-03-24 DIAGNOSIS — X32XXXD Exposure to sunlight, subsequent encounter: Secondary | ICD-10-CM | POA: Diagnosis not present

## 2023-03-24 DIAGNOSIS — D225 Melanocytic nevi of trunk: Secondary | ICD-10-CM | POA: Diagnosis not present

## 2023-03-31 ENCOUNTER — Other Ambulatory Visit: Payer: Medicare (Managed Care)

## 2023-03-31 DIAGNOSIS — I951 Orthostatic hypotension: Secondary | ICD-10-CM

## 2023-03-31 DIAGNOSIS — E871 Hypo-osmolality and hyponatremia: Secondary | ICD-10-CM

## 2023-04-01 LAB — COMPREHENSIVE METABOLIC PANEL
AG Ratio: 1.3 (calc) (ref 1.0–2.5)
ALT: 16 U/L (ref 9–46)
AST: 30 U/L (ref 10–35)
Albumin: 4.3 g/dL (ref 3.6–5.1)
Alkaline phosphatase (APISO): 114 U/L (ref 35–144)
BUN/Creatinine Ratio: 16 (calc) (ref 6–22)
BUN: 26 mg/dL — ABNORMAL HIGH (ref 7–25)
CO2: 28 mmol/L (ref 20–32)
Calcium: 9.7 mg/dL (ref 8.6–10.3)
Chloride: 95 mmol/L — ABNORMAL LOW (ref 98–110)
Creat: 1.67 mg/dL — ABNORMAL HIGH (ref 0.70–1.35)
Globulin: 3.2 g/dL (ref 1.9–3.7)
Glucose, Bld: 85 mg/dL (ref 65–99)
Potassium: 4.7 mmol/L (ref 3.5–5.3)
Sodium: 135 mmol/L (ref 135–146)
Total Bilirubin: 1 mg/dL (ref 0.2–1.2)
Total Protein: 7.5 g/dL (ref 6.1–8.1)

## 2023-04-15 ENCOUNTER — Ambulatory Visit: Payer: Medicare (Managed Care) | Admitting: Urology

## 2023-04-16 ENCOUNTER — Other Ambulatory Visit: Payer: Self-pay | Admitting: Family Medicine

## 2023-04-16 DIAGNOSIS — I4819 Other persistent atrial fibrillation: Secondary | ICD-10-CM

## 2023-04-16 DIAGNOSIS — I2510 Atherosclerotic heart disease of native coronary artery without angina pectoris: Secondary | ICD-10-CM

## 2023-04-16 DIAGNOSIS — Z87448 Personal history of other diseases of urinary system: Secondary | ICD-10-CM | POA: Insufficient documentation

## 2023-04-16 DIAGNOSIS — I509 Heart failure, unspecified: Secondary | ICD-10-CM

## 2023-04-16 DIAGNOSIS — R339 Retention of urine, unspecified: Secondary | ICD-10-CM | POA: Insufficient documentation

## 2023-04-16 DIAGNOSIS — E7800 Pure hypercholesterolemia, unspecified: Secondary | ICD-10-CM

## 2023-04-16 DIAGNOSIS — Z789 Other specified health status: Secondary | ICD-10-CM | POA: Insufficient documentation

## 2023-04-16 DIAGNOSIS — N401 Enlarged prostate with lower urinary tract symptoms: Secondary | ICD-10-CM | POA: Insufficient documentation
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
## 2023-04-19 ENCOUNTER — Ambulatory Visit: Payer: Medicare (Managed Care) | Admitting: Urology

## 2023-04-19 ENCOUNTER — Encounter: Payer: Self-pay | Admitting: Urology

## 2023-04-19 VITALS — BP 127/80 | HR 105 | Temp 99.3°F

## 2023-04-19 DIAGNOSIS — R820 Chyluria: Secondary | ICD-10-CM | POA: Diagnosis not present

## 2023-04-19 DIAGNOSIS — Z87448 Personal history of other diseases of urinary system: Secondary | ICD-10-CM | POA: Diagnosis not present

## 2023-04-19 DIAGNOSIS — N138 Other obstructive and reflux uropathy: Secondary | ICD-10-CM

## 2023-04-19 DIAGNOSIS — N401 Enlarged prostate with lower urinary tract symptoms: Secondary | ICD-10-CM

## 2023-04-19 DIAGNOSIS — Z789 Other specified health status: Secondary | ICD-10-CM

## 2023-04-19 DIAGNOSIS — R339 Retention of urine, unspecified: Secondary | ICD-10-CM

## 2023-04-19 LAB — URINALYSIS, ROUTINE W REFLEX MICROSCOPIC
Bilirubin, UA: NEGATIVE
Glucose, UA: NEGATIVE
Ketones, UA: NEGATIVE
Nitrite, UA: NEGATIVE
Specific Gravity, UA: 1.015 (ref 1.005–1.030)
Urobilinogen, Ur: 0.2 mg/dL (ref 0.2–1.0)
pH, UA: 6 (ref 5.0–7.5)

## 2023-04-19 LAB — MICROSCOPIC EXAMINATION: WBC, UA: >30 /[HPF] — AB (ref 0–5)

## 2023-04-19 MED ORDER — NITROFURANTOIN MONOHYD MACRO 100 MG PO CAPS: 100.0000 mg | ORAL_CAPSULE | Freq: Two times a day (BID) | ORAL | 0 refills | Status: DC

## 2023-04-22 LAB — URINE CULTURE

## 2023-04-26 ENCOUNTER — Telehealth: Payer: Self-pay | Admitting: Urology

## 2023-04-26 ENCOUNTER — Other Ambulatory Visit: Payer: Self-pay | Admitting: Urology

## 2023-04-26 DIAGNOSIS — R399 Unspecified symptoms and signs involving the genitourinary system: Secondary | ICD-10-CM

## 2023-04-26 MED ORDER — SULFAMETHOXAZOLE-TRIMETHOPRIM 800-160 MG PO TABS: 1.0000 | ORAL_TABLET | Freq: Two times a day (BID) | ORAL | 0 refills | Status: DC

## 2023-04-26 NOTE — Telephone Encounter (Signed)
**Note De-identified  [place] Obfuscation** Please advise 

## 2023-04-26 NOTE — Telephone Encounter (Signed)
 Called Pt to let him know the NP sent in bactrim  for UTI

## 2023-04-26 NOTE — Telephone Encounter (Signed)
 error

## 2023-04-26 NOTE — Telephone Encounter (Signed)
 Patient was given antibiotic last week and he still has UTI.

## 2023-05-03 ENCOUNTER — Other Ambulatory Visit: Payer: Self-pay | Admitting: Family Medicine

## 2023-05-03 DIAGNOSIS — E7800 Pure hypercholesterolemia, unspecified: Secondary | ICD-10-CM

## 2023-05-03 DIAGNOSIS — I2510 Atherosclerotic heart disease of native coronary artery without angina pectoris: Secondary | ICD-10-CM

## 2023-05-04 ENCOUNTER — Telehealth: Payer: Self-pay

## 2023-05-04 NOTE — Telephone Encounter (Signed)
16 F straight tip CIC supplies sent to [facility]

## 2023-05-06 ENCOUNTER — Other Ambulatory Visit: Payer: Self-pay | Admitting: Family Medicine

## 2023-05-06 DIAGNOSIS — I4819 Other persistent atrial fibrillation: Secondary | ICD-10-CM

## 2023-05-13 ENCOUNTER — Telehealth: Payer: Self-pay | Admitting: Urology

## 2023-05-13 ENCOUNTER — Other Ambulatory Visit: Payer: Medicare (Managed Care)

## 2023-05-13 ENCOUNTER — Other Ambulatory Visit: Payer: Self-pay | Admitting: Family Medicine

## 2023-05-13 DIAGNOSIS — I509 Heart failure, unspecified: Secondary | ICD-10-CM

## 2023-05-13 DIAGNOSIS — R399 Unspecified symptoms and signs involving the genitourinary system: Secondary | ICD-10-CM

## 2023-05-13 DIAGNOSIS — I4891 Unspecified atrial fibrillation: Secondary | ICD-10-CM

## 2023-05-13 LAB — URINALYSIS, ROUTINE W REFLEX MICROSCOPIC
Bilirubin, UA: NEGATIVE
Glucose, UA: NEGATIVE
Ketones, UA: NEGATIVE
Nitrite, UA: NEGATIVE
Specific Gravity, UA: 1.01 (ref 1.005–1.030)
Urobilinogen, Ur: 0.2 mg/dL (ref 0.2–1.0)
pH, UA: 6 (ref 5.0–7.5)

## 2023-05-13 LAB — MICROSCOPIC EXAMINATION
RBC, Urine: >30 /[HPF] — AB (ref 0–2)
WBC, UA: >30 /[HPF] — AB (ref 0–5)

## 2023-05-13 NOTE — Telephone Encounter (Signed)
 Had uti and Sarah sent in RX he is still having uti symptoms. Coming by today for a urine specimen.

## 2023-05-13 NOTE — Telephone Encounter (Signed)
 Dysuria  Patient called with c/o dysuria x 2-3 days days.  Pain: dull  Severity:6/10  Associated Signs and Symptoms:  Fever: noTemp.0 Chills: no Hematuria: no Urgency: no Frequency: no Hesitancy:no Incontinence: no Nausea: no Vomiting: no  Urologic History:  Any Recent Urologic Surgeries or Procedures:no Recurrent UTI's:yes Cystitis: no  Prostatitis:no Kidney or Bladder Stones: no Plan: Walk-in Clinic: no Appointment w/Physician: [no Lab visit scheduled for urine drop off: Yes Advice given:  Do you take on daily medications for UTI suppression No    Patient called and state's he is having cloudy urine and dull abdomen pain.

## 2023-05-14 ENCOUNTER — Other Ambulatory Visit: Payer: Medicare (Managed Care)

## 2023-05-14 NOTE — Telephone Encounter (Signed)
 Patient called checking on results of urine

## 2023-05-14 NOTE — Telephone Encounter (Signed)
 Patient was made and voiced understanding "Culture result pending"

## 2023-05-17 ENCOUNTER — Other Ambulatory Visit: Payer: Self-pay | Admitting: Urology

## 2023-05-17 ENCOUNTER — Telehealth: Payer: Self-pay

## 2023-05-17 DIAGNOSIS — R399 Unspecified symptoms and signs involving the genitourinary system: Secondary | ICD-10-CM

## 2023-05-17 LAB — URINE CULTURE

## 2023-05-17 MED ORDER — NITROFURANTOIN MONOHYD MACRO 100 MG PO CAPS: 100.0000 mg | ORAL_CAPSULE | Freq: Two times a day (BID) | ORAL | 0 refills | Status: DC

## 2023-05-17 NOTE — Telephone Encounter (Signed)
 Patient was made aware and verbalized understanding. Patient state's that the Macrobid did not work a week ago and wants to know if this antibiotic is going to work. He state's he has been dealing with this UTI for a few months now. Patient is aware a message will be sent to Maralyn Sago on alternatives.

## 2023-05-17 NOTE — Telephone Encounter (Signed)
 Patient made aware via phone message.

## 2023-05-17 NOTE — Telephone Encounter (Signed)
 Tried calling patient with no answer, left vm for return call

## 2023-05-17 NOTE — Telephone Encounter (Signed)
 Patient call back to return call to shaniqua

## 2023-05-17 NOTE — Telephone Encounter (Signed)
-----   Message from Donnita Falls sent at 05/17/2023  8:46 AM EST ----- Please let pt know urine culture result. I have sent prescription for Macrobid. Thanks.

## 2023-05-18 ENCOUNTER — Other Ambulatory Visit: Payer: Self-pay | Admitting: Urology

## 2023-05-18 DIAGNOSIS — R820 Chyluria: Secondary | ICD-10-CM

## 2023-05-18 DIAGNOSIS — N1339 Other hydronephrosis: Secondary | ICD-10-CM

## 2023-05-18 DIAGNOSIS — Z720 Tobacco use: Secondary | ICD-10-CM

## 2023-05-18 DIAGNOSIS — N138 Other obstructive and reflux uropathy: Secondary | ICD-10-CM

## 2023-05-18 DIAGNOSIS — R339 Retention of urine, unspecified: Secondary | ICD-10-CM

## 2023-05-18 DIAGNOSIS — Z789 Other specified health status: Secondary | ICD-10-CM

## 2023-05-18 DIAGNOSIS — R399 Unspecified symptoms and signs involving the genitourinary system: Secondary | ICD-10-CM

## 2023-05-18 MED ORDER — CIPROFLOXACIN HCL 500 MG PO TABS: 500.0000 mg | ORAL_TABLET | Freq: Two times a day (BID) | ORAL | 0 refills | Status: AC

## 2023-05-18 MED ORDER — METHENAMINE HIPPURATE 1 G PO TABS
1.0000 g | ORAL_TABLET | Freq: Two times a day (BID) | ORAL | 11 refills | Status: DC
Start: 2023-05-18 — End: 2024-02-01

## 2023-05-18 NOTE — Progress Notes (Signed)
>   04/19/2023:  - Reported UTI symptoms.  - Urine culture positive for >100k E. Coli (MDRO).  - Treated with Bactrim DS.   > 05/13/2023:  - Again reports UTI symptoms.  - Urine culture positive for >100k E. Coli (MDRO).  - Retreated with Macrobid for relapsing UTI.   > 05/17/2023:  - Still reporting UTI symptoms. Per phone note: "He state's he has been dealing with this UTI for a few months now." - Retreated with Cipro for suspected UTI. Limited options for oral antibiotic treatment due to multi-drug resistant organism; has already failed the other two possible options (Bactrim & Macrobid).  - Will advise after completing the Cipro course that he starts Hiprex 2x/day for UTI prevention / to suppress bacteriuria. - Also ordered CT abdomen/pelvis w/wo contrast for further evaluation. Need to differentiate if the etiology of his persistent bothersome irritative symptoms is genuinely due to a refractory relapsing UTI versus potential underlying GU malignancy (risk factors: age, smoker). It's possible that his positive urine cultures are unrelated / due to asymptomatic bacteruria from him self-cathing 2x/day. CT will also assess for potential infectious nidus.  - Will plan for follow up as scheduled with Dr. Mena Goes on 06/21/2023. May warrant repeat cystoscopy at that visit depending on CT findings.    Evette Georges, MSN, FNP-C, [facility] Nurse Practitioner [facility] [place]

## 2023-05-19 NOTE — Telephone Encounter (Signed)
"  Please let patient know:   1. I canceled the Macrobid prescription. Sent in prescription for Cipro instead.  - Limited options for oral antibiotic treatment due to multi-drug resistant organism; has already failed the other two possible options (Bactrim & Macrobid) based on past two urine culture sensitivity reports.   2. After completing Cipro, start Hiprex 2x/day for UTI prevention / to suppress bacteriuria.   3. I ordered CT abdomen/pelvis w/wo contrast for further evaluation. Need to differentiate if the etiology of his persistent bothersome irritative symptoms is genuinely due to a refractory relapsing UTI versus potential underlying GU malignancy (risk factors: age, smoker). It's possible that his positive urine cultures are unrelated / due to asymptomatic bacteruria from him self-cathing 2x/day. CT will also assess for potential infectious nidus (such as a stone).   4. Keep follow up as scheduled with Dr. Mena Goes on 06/21/2023. May warrant repeat cystoscopy at that visit depending on CT findings. " Patient was made aware and verbalized understanding.

## 2023-06-15 ENCOUNTER — Ambulatory Visit (HOSPITAL_COMMUNITY)
Admission: RE | Admit: 2023-06-15 | Discharge: 2023-06-15 | Disposition: A | Discharge status: Home / Self Care | Payer: Medicare (Managed Care) | Source: Ambulatory Visit | Attending: Urology | Admitting: Urology

## 2023-06-15 ENCOUNTER — Ambulatory Visit (HOSPITAL_COMMUNITY): Payer: Medicare (Managed Care)

## 2023-06-15 DIAGNOSIS — R820 Chyluria: Secondary | ICD-10-CM | POA: Diagnosis not present

## 2023-06-15 DIAGNOSIS — R399 Unspecified symptoms and signs involving the genitourinary system: Secondary | ICD-10-CM | POA: Insufficient documentation

## 2023-06-15 DIAGNOSIS — N138 Other obstructive and reflux uropathy: Secondary | ICD-10-CM | POA: Diagnosis not present

## 2023-06-15 DIAGNOSIS — N401 Enlarged prostate with lower urinary tract symptoms: Secondary | ICD-10-CM | POA: Diagnosis not present

## 2023-06-15 DIAGNOSIS — R339 Retention of urine, unspecified: Secondary | ICD-10-CM | POA: Insufficient documentation

## 2023-06-15 DIAGNOSIS — Z720 Tobacco use: Secondary | ICD-10-CM | POA: Diagnosis not present

## 2023-06-15 DIAGNOSIS — Z789 Other specified health status: Secondary | ICD-10-CM | POA: Insufficient documentation

## 2023-06-15 DIAGNOSIS — N1339 Other hydronephrosis: Secondary | ICD-10-CM | POA: Insufficient documentation

## 2023-06-15 DIAGNOSIS — Z87448 Personal history of other diseases of urinary system: Secondary | ICD-10-CM | POA: Diagnosis not present

## 2023-06-15 LAB — POCT I-STAT CREATININE: Creatinine, Ser: 1.9 mg/dL — ABNORMAL HIGH (ref 0.61–1.24)

## 2023-06-15 MED ORDER — IOHEXOL 300 MG/ML  SOLN
100.0000 mL | Freq: Once | INTRAMUSCULAR | Status: AC | PRN
  Administered 2023-06-15: 80 mL via INTRAVENOUS

## 2023-06-16 ENCOUNTER — Telehealth: Payer: Self-pay

## 2023-06-16 NOTE — Telephone Encounter (Signed)
 Pt called to verify if they still needed catheter supply OV note doesn't not have details of needed supply

## 2023-06-16 NOTE — Telephone Encounter (Signed)
 Medication prior authorization request received.  Completed PA request through cover my meds for drug Methenamine Mandelate 1 GM Tablet. KEY: [id number]  Approved: Pending

## 2023-06-16 NOTE — Telephone Encounter (Signed)
 Patient left message returning call.

## 2023-06-17 NOTE — Telephone Encounter (Signed)
 Pt called to verify they still need cath supplies Pt states he does still self cath but would like to talk to MD [place]ridge about next steps and if he needs a refill he will let us know at his Monday appt No cath supply rx in chart will need new script

## 2023-06-21 ENCOUNTER — Ambulatory Visit: Payer: Medicare (Managed Care) | Admitting: Urology

## 2023-06-21 VITALS — BP 130/74 | HR 97

## 2023-06-21 DIAGNOSIS — R3121 Asymptomatic microscopic hematuria: Secondary | ICD-10-CM | POA: Diagnosis not present

## 2023-06-21 DIAGNOSIS — R399 Unspecified symptoms and signs involving the genitourinary system: Secondary | ICD-10-CM | POA: Diagnosis not present

## 2023-06-21 DIAGNOSIS — R339 Retention of urine, unspecified: Secondary | ICD-10-CM

## 2023-06-21 LAB — MICROSCOPIC EXAMINATION: WBC, UA: >30 /HPF — AB (ref 0–5)

## 2023-06-21 LAB — URINALYSIS, ROUTINE W REFLEX MICROSCOPIC
Bilirubin, UA: NEGATIVE
Glucose, UA: NEGATIVE
Ketones, UA: NEGATIVE
Nitrite, UA: POSITIVE — AB
Specific Gravity, UA: <1.005 — ABNORMAL LOW (ref 1.005–1.030)
Urobilinogen, Ur: 0.2 mg/dL (ref 0.2–1.0)
pH, UA: 6 (ref 5.0–7.5)
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
## 2023-06-22 ENCOUNTER — Other Ambulatory Visit: Payer: Self-pay | Admitting: Family Medicine

## 2023-06-22 DIAGNOSIS — I509 Heart failure, unspecified: Secondary | ICD-10-CM

## 2023-06-22 DIAGNOSIS — I4891 Unspecified atrial fibrillation: Secondary | ICD-10-CM

## 2023-06-23 NOTE — Telephone Encounter (Signed)
 Requested Prescriptions  Pending Prescriptions Disp Refills   metoprolol succinate (TOPROL-XL) 25 MG 24 hr tablet [Pharmacy Med Name: Metoprolol Succinate ER 25 MG Oral Tablet Extended Release 24 Hour] 90 tablet 0    Sig: Take 1 tablet by mouth once daily     Cardiovascular:  Beta Blockers Passed - 06/23/2023 11:10 AM      Passed - Last BP in normal range    BP Readings from Last 1 Encounters:  06/21/23 130/74         Passed - Last Heart Rate in normal range    Pulse Readings from Last 1 Encounters:  06/21/23 97         Passed - Valid encounter within last 6 months    Recent Outpatient Visits           3 months ago Colon cancer screening   [place] [facility] Donita Brooks, MD   1 year ago Congestive heart failure, unspecified HF chronicity, unspecified heart failure type [facility])   [place] [facility] Donita Brooks, MD   1 year ago Urinary tract infection without hematuria, site unspecified   [place] [facility]   1 year ago Elevated serum creatinine   [place] [facility] Pickard, Priscille Heidelberg, MD       Future Appointments             In 1 month Mallipeddi, Orion Modest, MD [facility] at [place], [place]

## 2023-06-28 ENCOUNTER — Ambulatory Visit: Payer: Medicare (Managed Care) | Admitting: Family Medicine

## 2023-06-28 VITALS — BP 130/74 | Ht 73.0 in | Wt 177.0 lb

## 2023-06-28 DIAGNOSIS — Z0000 Encounter for general adult medical examination without abnormal findings: Secondary | ICD-10-CM

## 2023-06-28 NOTE — Patient Instructions (Signed)
 Health Maintenance, Male  Adopting a healthy lifestyle and getting preventive care are important in promoting health and wellness. Ask your health care provider about:  The right schedule for you to have regular tests and exams.  Things you can do on your own to prevent diseases and keep yourself healthy.  What should I know about diet, weight, and exercise?  Eat a healthy diet    Eat a diet that includes plenty of vegetables, fruits, low-fat dairy products, and lean protein.  Do not eat a lot of foods that are high in solid fats, added sugars, or sodium.  Maintain a healthy weight  Body mass index (BMI) is a measurement that can be used to identify possible weight problems. It estimates body fat based on height and weight. Your health care provider can help determine your BMI and help you achieve or maintain a healthy weight.  Get regular exercise  Get regular exercise. This is one of the most important things you can do for your health. Most adults should:  Exercise for at least 150 minutes each week. The exercise should increase your heart rate and make you sweat (moderate-intensity exercise).  Do strengthening exercises at least twice a week. This is in addition to the moderate-intensity exercise.  Spend less time sitting. Even light physical activity can be beneficial.  Watch cholesterol and blood lipids  Have your blood tested for lipids and cholesterol at 70 years of age, then have this test every 5 years.  You may need to have your cholesterol levels checked more often if:  Your lipid or cholesterol levels are high.  You are older than 70 years of age.  You are at high risk for heart disease.  What should I know about cancer screening?  Many types of cancers can be detected early and may often be prevented. Depending on your health history and family history, you may need to have cancer screening at various ages. This may include screening for:  Colorectal cancer.  Prostate cancer.  Skin cancer.  Lung  cancer.  What should I know about heart disease, diabetes, and high blood pressure?  Blood pressure and heart disease  High blood pressure causes heart disease and increases the risk of stroke. This is more likely to develop in people who have high blood pressure readings or are overweight.  Talk with your health care provider about your target blood pressure readings.  Have your blood pressure checked:  Every 3-5 years if you are 9-95 years of age.  Every year if you are 85 years old or older.  If you are between the ages of 29 and 29 and are a current or former smoker, ask your health care provider if you should have a one-time screening for abdominal aortic aneurysm (AAA).  Diabetes  Have regular diabetes screenings. This checks your fasting blood sugar level. Have the screening done:  Once every three years after age 23 if you are at a normal weight and have a low risk for diabetes.  More often and at a younger age if you are overweight or have a high risk for diabetes.  What should I know about preventing infection?  Hepatitis B  If you have a higher risk for hepatitis B, you should be screened for this virus. Talk with your health care provider to find out if you are at risk for hepatitis B infection.  Hepatitis C  Blood testing is recommended for:  Everyone born from [birth year] through 1967.  Anyone  with known risk factors for hepatitis C.  Sexually transmitted infections (STIs)  You should be screened each year for STIs, including gonorrhea and chlamydia, if:  You are sexually active and are younger than 70 years of age.  You are older than 70 years of age and your health care provider tells you that you are at risk for this type of infection.  Your sexual activity has changed since you were last screened, and you are at increased risk for chlamydia or gonorrhea. Ask your health care provider if you are at risk.  Ask your health care provider about whether you are at high risk for HIV. Your health care provider  may recommend a prescription medicine to help prevent HIV infection. If you choose to take medicine to prevent HIV, you should first get tested for HIV. You should then be tested every 3 months for as long as you are taking the medicine.  Follow these instructions at home:  Alcohol use  Do not drink alcohol if your health care provider tells you not to drink.  If you drink alcohol:  Limit how much you have to 0-2 drinks a day.  Know how much alcohol is in your drink. In the [place] one drink equals one 12 oz bottle of beer (355 mL), one 5 oz glass of wine (148 mL), or one 1 oz glass of hard liquor (44 mL).  Lifestyle  Do not use any products that contain nicotine or tobacco. These products include cigarettes, chewing tobacco, and vaping devices, such as e-cigarettes. If you need help quitting, ask your health care provider.  Do not use street drugs.  Do not share needles.  Ask your health care provider for help if you need support or information about quitting drugs.  General instructions  Schedule regular health, dental, and eye exams.  Stay current with your vaccines.  Tell your health care provider if:  You often feel depressed.  You have ever been abused or do not feel safe at home.  Summary  Adopting a healthy lifestyle and getting preventive care are important in promoting health and wellness.  Follow your health care provider's instructions about healthy diet, exercising, and getting tested or screened for diseases.  Follow your health care provider's instructions on monitoring your cholesterol and blood pressure.  This information is not intended to replace advice given to you by your health care provider. Make sure you discuss any questions you have with your health care provider.  Document Revised: 07/22/2020 Document Reviewed: 07/22/2020  Elsevier Patient Education  2024 [employer].
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
## 2023-06-29 DIAGNOSIS — R339 Retention of urine, unspecified: Secondary | ICD-10-CM | POA: Diagnosis not present

## 2023-07-21 ENCOUNTER — Other Ambulatory Visit: Payer: Self-pay | Admitting: Internal Medicine

## 2023-07-21 DIAGNOSIS — I10 Essential (primary) hypertension: Secondary | ICD-10-CM

## 2023-07-21 DIAGNOSIS — I509 Heart failure, unspecified: Secondary | ICD-10-CM

## 2023-07-26 ENCOUNTER — Ambulatory Visit: Payer: No Typology Code available for payment source | Admitting: Urology

## 2023-07-27 ENCOUNTER — Other Ambulatory Visit: Payer: Self-pay | Admitting: Family Medicine

## 2023-07-27 DIAGNOSIS — I10 Essential (primary) hypertension: Secondary | ICD-10-CM

## 2023-07-27 DIAGNOSIS — I509 Heart failure, unspecified: Secondary | ICD-10-CM

## 2023-07-27 NOTE — Telephone Encounter (Signed)
 Copied from CRM [id number]. Topic: Clinical - Medication Refill >> Jul 27, 2023 11:46 AM Rosaria Common wrote: Medication: losartan  (COZAAR ) 50 MG tablet  Has the patient contacted their pharmacy? Yes (Agent: If no, request that the patient contact the pharmacy for the refill. If patient does not wish to contact the pharmacy document the reason why and proceed with request.) (Agent: If yes, when and what did the pharmacy advise?)  This is the patient's preferred pharmacy:  [facility] [address], [place] - [address] [place] [address] Phone: [phone] Fax: [phone]    Is this the correct pharmacy for this prescription? Yes If no, delete pharmacy and type the correct one.   Has the prescription been filled recently? No  Is the patient out of the medication? No  Has the patient been seen for an appointment in the last year OR does the patient have an upcoming appointment? Yes  Can we respond through MyChart? Yes  Agent: Please be advised that Rx refills may take up to 3 business days. We ask that you follow-up with your pharmacy.

## 2023-07-29 ENCOUNTER — Ambulatory Visit: Payer: Medicare (Managed Care) | Attending: Internal Medicine | Admitting: Internal Medicine

## 2023-07-29 VITALS — BP 132/74 | HR 80 | Ht 73.0 in | Wt 177.4 lb

## 2023-07-29 DIAGNOSIS — I509 Heart failure, unspecified: Secondary | ICD-10-CM | POA: Diagnosis not present

## 2023-07-29 DIAGNOSIS — I440 Atrioventricular block, first degree: Secondary | ICD-10-CM | POA: Insufficient documentation

## 2023-07-29 DIAGNOSIS — I5022 Chronic systolic (congestive) heart failure: Secondary | ICD-10-CM

## 2023-07-29 NOTE — Patient Instructions (Signed)
 Medication Instructions:  Your physician recommends that you continue on your current medications as directed. Please refer to the Current Medication list given to you today.   Labwork: None  Testing/Procedures: Your physician has requested that you have an echocardiogram. Echocardiography is a painless test that uses sound waves to create images of your heart. It provides your doctor with information about the size and shape of your heart and how well your heart's chambers and valves are working. This procedure takes approximately one hour. There are no restrictions for this procedure. Please do NOT wear cologne, perfume, aftershave, or lotions (deodorant is allowed). Please arrive 15 minutes prior to your appointment time.  Please note: We ask at that you not bring children with you during ultrasound (echo/ vascular) testing. Due to room size and safety concerns, children are not allowed in the ultrasound rooms during exams. Our front office staff cannot provide observation of children in our lobby area while testing is being conducted. An adult accompanying a patient to their appointment will only be allowed in the ultrasound room at the discretion of the ultrasound technician under special circumstances. We apologize for any inconvenience.   Follow-Up: Your physician recommends that you schedule a follow-up appointment in: 1 year. You will receive a reminder call in about 8 months reminding you to schedule your appointment. If you don't receive this call, please contact our office.   Any Other Special Instructions Will Be Listed Below (If Applicable).  If you need a refill on your cardiac medications before your next appointment, please call your pharmacy.

## 2023-07-29 NOTE — Telephone Encounter (Signed)
 Requested Prescriptions  Refused Prescriptions Disp Refills   losartan  (COZAAR ) 50 MG tablet 90 tablet 0    Sig: Take 1 tablet (50 mg total) by mouth daily.     Cardiovascular:  Angiotensin Receptor Blockers Failed - 07/29/2023  9:43 AM      Failed - Cr in normal range and within 180 days    Creat  Date Value Ref Range Status  03/31/2023 1.67 (H) 0.70 - 1.35 mg/dL Final   Creatinine, Ser  Date Value Ref Range Status  06/15/2023 1.90 (H) 0.61 - 1.24 mg/dL Final   Creatinine, Urine  Date Value Ref Range Status  03/06/2022 51 20 - 320 mg/dL Final         Passed - K in normal range and within 180 days    Potassium  Date Value Ref Range Status  03/31/2023 4.7 3.5 - 5.3 mmol/L Final         Passed - Patient is not pregnant      Passed - Last BP in normal range    BP Readings from Last 1 Encounters:  07/29/23 132/74         Passed - Valid encounter within last 6 months    Recent Outpatient Visits           1 month ago Encounter for Medicare annual wellness exam   [place] [facility] Austine Lefort, MD   4 months ago Colon cancer screening   [place] [facility] Austine Lefort, MD   1 year ago Congestive heart failure, unspecified HF chronicity, unspecified heart failure type [facility])   [place] [facility] Austine Lefort, MD   1 year ago Urinary tract infection without hematuria, site unspecified   [place] [facility]   1 year ago Elevated serum creatinine   [place] [facility] Pickard, Cisco Crest, MD
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
## 2023-08-02 ENCOUNTER — Ambulatory Visit: Payer: Medicare (Managed Care) | Admitting: Urology

## 2023-08-17 ENCOUNTER — Ambulatory Visit: Payer: Medicare (Managed Care) | Attending: Internal Medicine

## 2023-08-17 DIAGNOSIS — I5022 Chronic systolic (congestive) heart failure: Secondary | ICD-10-CM

## 2023-08-17 MED ORDER — PERFLUTREN LIPID MICROSPHERE
1.0000 mL | INTRAVENOUS | Status: AC | PRN
  Administered 2023-08-17: 3 mL via INTRAVENOUS

## 2023-08-18 ENCOUNTER — Telehealth: Payer: Self-pay | Admitting: Cardiovascular Disease

## 2023-08-18 NOTE — Telephone Encounter (Signed)
 Patient would like a call back when echo results are available.

## 2023-08-19 LAB — ECHOCARDIOGRAM COMPLETE
AR max vel: 3.41 cm2
AV Area VTI: 2.82 cm2
AV Area mean vel: 2.96 cm2
AV Mean grad: 3 mmHg
AV Peak grad: 4.8 mmHg
Ao pk vel: 1.09 m/s
Area-P 1/2: 4.74 cm2
Calc EF: 62.9 %
MV VTI: 3.77 cm2
S' Lateral: 4.1 cm
Single Plane A2C EF: 69.6 %
Single Plane A4C EF: 53.4 %

## 2023-08-20 ENCOUNTER — Ambulatory Visit: Payer: Self-pay | Admitting: Internal Medicine

## 2023-08-23 NOTE — Telephone Encounter (Signed)
 Patient returned staff call regarding results.

## 2023-08-23 NOTE — Telephone Encounter (Signed)
-----   Message from Vishnu P Mallipeddi sent at 08/20/2023  4:15 PM EDT ----- LV function is 45 to 50% (stable, previously 40 to 45%), normal RV function, no valvular heart disease and CVP 3 mmHg.  Continue current medications, no changes in the plan.

## 2023-08-23 NOTE — Telephone Encounter (Signed)
 Left detailed message per DPR and sent copy to PCP. Sent patient MyChart message as well.

## 2023-09-27 DIAGNOSIS — R339 Retention of urine, unspecified: Secondary | ICD-10-CM | POA: Diagnosis not present

## 2023-10-31 ENCOUNTER — Other Ambulatory Visit: Payer: Self-pay | Admitting: Internal Medicine

## 2023-10-31 DIAGNOSIS — I509 Heart failure, unspecified: Secondary | ICD-10-CM

## 2023-10-31 DIAGNOSIS — I10 Essential (primary) hypertension: Secondary | ICD-10-CM

## 2023-11-11 DIAGNOSIS — H5203 Hypermetropia, bilateral: Secondary | ICD-10-CM | POA: Diagnosis not present

## 2023-11-11 DIAGNOSIS — H524 Presbyopia: Secondary | ICD-10-CM | POA: Diagnosis not present

## 2023-11-11 DIAGNOSIS — H52223 Regular astigmatism, bilateral: Secondary | ICD-10-CM | POA: Diagnosis not present

## 2023-11-15 ENCOUNTER — Other Ambulatory Visit: Payer: Self-pay | Admitting: Family Medicine

## 2023-11-15 DIAGNOSIS — I4891 Unspecified atrial fibrillation: Secondary | ICD-10-CM

## 2023-11-15 DIAGNOSIS — I509 Heart failure, unspecified: Secondary | ICD-10-CM

## 2023-11-30 DIAGNOSIS — H2640 Unspecified secondary cataract: Secondary | ICD-10-CM | POA: Diagnosis not present

## 2023-11-30 DIAGNOSIS — Z961 Presence of intraocular lens: Secondary | ICD-10-CM | POA: Diagnosis not present

## 2023-12-06 ENCOUNTER — Other Ambulatory Visit: Payer: Self-pay | Admitting: Family Medicine

## 2023-12-06 DIAGNOSIS — I4819 Other persistent atrial fibrillation: Secondary | ICD-10-CM

## 2023-12-08 DIAGNOSIS — H26491 Other secondary cataract, right eye: Secondary | ICD-10-CM | POA: Diagnosis not present

## 2023-12-23 ENCOUNTER — Ambulatory Visit: Payer: Self-pay | Admitting: *Deleted

## 2023-12-23 NOTE — Telephone Encounter (Signed)
 FYI Only or Action Required?: FYI only for provider.  Patient was last seen in primary care on 06/28/2023 by Duanne Butler DASEN, MD.  Called Nurse Triage reporting [place].  Symptoms began 10 days ago.  Interventions attempted: OTC medications: stye medication.  Symptoms are: gradually worsening.  Triage Disposition: See PCP When Office is Open (Within 3 Days)  Patient/caregiver understands and will follow disposition?: Yes              Copied from CRM #[id number]. Topic: Clinical - Red Word Triage >> Dec 23, 2023  8:03 AM Charlet HERO wrote: Red Word that prompted transfer to Nurse Triage: Patient is stating that he has boil under his eye it is red, irritated and hurts has been there for 10 days. Pickard. Reason for Disposition  [1] Boil AND [2] not improved > 3 days following Care Advice  Answer Assessment - Initial Assessment Questions Appt offered tomorrow. Patient reports he would like to come in early am instead of 11. Scheduled appt for 12/28/23. Recommended if sx worsen call back.      1. APPEARANCE of BOIL: What does the boil look like?      Bottom eyelid boil red, looks irritated 2. LOCATION: Where is the boil located?      Under left eyelid 3. NUMBER: How many boils are there?      1 4. SIZE: How big is the boil? (e.g., inches, cm; compare to size of a coin or other object)     Pea size  5. ONSET: When did the boil start?     10 days ago 6. PAIN: Is there any pain? If Yes, ask: How bad is the pain?   (Scale 1-10; or mild, moderate, severe)     3/10 7. FEVER: Do you have a fever? If Yes, ask: What is it, how was it measured, and when did it start?      na 8. SOURCE: Have you been around anyone with boils or other Staph infections? Have you ever had boils before?     No  9. OTHER SYMPTOMS: Do you have any other symptoms? (e.g., shaking chills, weakness, rash elsewhere on body)     No . Reports someone getting rid of blackheads on face  and then spot noted under eyelid. Now draining clear fluid at times. 10. PREGNANCY: Is there any chance you are pregnant? When was your last menstrual period?       na  Protocols used: Boil (Skin Abscess)-A-AH

## 2023-12-24 DIAGNOSIS — R339 Retention of urine, unspecified: Secondary | ICD-10-CM | POA: Diagnosis not present

## 2023-12-28 ENCOUNTER — Ambulatory Visit: Payer: Medicare (Managed Care) | Admitting: Family Medicine

## 2024-01-17 ENCOUNTER — Other Ambulatory Visit (HOSPITAL_BASED_OUTPATIENT_CLINIC_OR_DEPARTMENT_OTHER): Payer: Self-pay | Admitting: *Deleted

## 2024-01-17 ENCOUNTER — Telehealth (HOSPITAL_BASED_OUTPATIENT_CLINIC_OR_DEPARTMENT_OTHER): Payer: Self-pay

## 2024-01-17 NOTE — Telephone Encounter (Signed)
   Pre-operative Risk Assessment    Patient Name: Cory Valentine  DOB: 23-Dec-1955 MRN: [id number]   Date of last office visit: 07/29/23 with Dr. 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 Luciano Surgeon's Group or Practice Name:  [facility] Phone number:  [phone] Fax number:  [phone]   Type of Clearance Requested:   - Medical  - Pharmacy:  Hold Apixaban  (Eliquis ) not indicated   Type of Anesthesia:  Not Indicated    Additional requests/questions:    Bonney Augustin JONETTA Delores   01/17/2024, 2:20 PM

## 2024-01-22 ENCOUNTER — Other Ambulatory Visit: Payer: Self-pay | Admitting: Family Medicine

## 2024-01-22 DIAGNOSIS — I2510 Atherosclerotic heart disease of native coronary artery without angina pectoris: Secondary | ICD-10-CM

## 2024-01-22 DIAGNOSIS — E7800 Pure hypercholesterolemia, unspecified: Secondary | ICD-10-CM

## 2024-01-24 NOTE — Telephone Encounter (Signed)
 Requested Prescriptions  Pending Prescriptions Disp Refills   atorvastatin  (LIPITOR) 10 MG tablet [Pharmacy Med Name: Atorvastatin  Calcium  10 MG Oral Tablet] 90 tablet 0    Sig: Take 1 tablet by mouth once daily     Cardiovascular:  Antilipid - Statins Failed - 01/24/2024  3:42 PM      Failed - Lipid Panel in normal range within the last 12 months    Cholesterol, Total  Date Value Ref Range Status  06/25/2017 144 100 - 199 mg/dL Final   Cholesterol  Date Value Ref Range Status  03/08/2023 126 <200 mg/dL Final   LDL Cholesterol (Calc)  Date Value Ref Range Status  03/08/2023 48 mg/dL (calc) Final    Comment:    Reference range: <100 . Desirable range <100 mg/dL for primary prevention;   <70 mg/dL for patients with CHD or diabetic patients  with > or = 2 CHD risk factors. SABRA LDL-C is now calculated using the Martin-Hopkins  calculation, which is a validated novel method providing  better accuracy than the Friedewald equation in the  estimation of LDL-C.  Gladis APPLETHWAITE et al. SANDREA. 7986;689(80): 2061-2068  (http://education.[employer].com/faq/FAQ164)    HDL  Date Value Ref Range Status  03/08/2023 65 > OR = 40 mg/dL Final  95/87/7980 74 >60 mg/dL Final   Triglycerides  Date Value Ref Range Status  03/08/2023 51 <150 mg/dL Final         Passed - Patient is not pregnant      Passed - Valid encounter within last 12 months    Recent Outpatient Visits           7 months ago Encounter for Medicare annual wellness exam   [place] [facility] Duanne Butler DASEN, MD   10 months ago Colon cancer screening   [place] [facility] Duanne Butler DASEN, MD   1 year ago Congestive heart failure, unspecified HF chronicity, unspecified heart failure type [facility])   [place] [facility] Duanne Butler DASEN, MD   1 year ago Urinary tract infection without hematuria, site unspecified   [place] [facility]   1  year ago Elevated serum creatinine    [facility] Pickard, Butler DASEN, MD

## 2024-01-26 ENCOUNTER — Telehealth: Payer: Self-pay

## 2024-01-26 NOTE — Telephone Encounter (Signed)
 Patient has been scheduled for televisit and consent done     Patient Consent for Virtual Visit         [name] has provided verbal consent on 01/26/2024 for a virtual visit (video or telephone).   CONSENT FOR VIRTUAL VISIT FOR:  Cory Valentine  By participating in this virtual visit I agree to the following:  I hereby voluntarily request, consent and authorize [facility] and its employed or contracted physicians, physician assistants, nurse practitioners or other licensed health care professionals (the Practitioner), to provide me with telemedicine health care services (the "Services) as deemed necessary by the treating Practitioner. I acknowledge and consent to receive the Services by the Practitioner via telemedicine. I understand that the telemedicine visit will involve communicating with the Practitioner through live audiovisual communication technology and the disclosure of certain medical information by electronic transmission. I acknowledge that I have been given the opportunity to request an in-person assessment or other available alternative prior to the telemedicine visit and am voluntarily participating in the telemedicine visit.  I understand that I have the right to withhold or withdraw my consent to the use of telemedicine in the course of my care at any time, without affecting my right to future care or treatment, and that the Practitioner or I may terminate the telemedicine visit at any time. I understand that I have the right to inspect all information obtained and/or recorded in the course of the telemedicine visit and may receive copies of available information for a reasonable fee.  I understand that some of the potential risks of receiving the Services via telemedicine include:  Delay or interruption in medical evaluation due to technological equipment failure or disruption; Information transmitted may not be sufficient (e.g. poor resolution of images) to  allow for appropriate medical decision making by the Practitioner; and/or  In rare instances, security protocols could fail, causing a breach of personal health information.  Furthermore, I acknowledge that it is my responsibility to provide information about my medical history, conditions and care that is complete and accurate to the best of my ability. I acknowledge that Practitioner's advice, recommendations, and/or decision may be based on factors not within their control, such as incomplete or inaccurate data provided by me or distortions of diagnostic images or specimens that may result from electronic transmissions. I understand that the practice of medicine is not an exact science and that Practitioner makes no warranties or guarantees regarding treatment outcomes. I acknowledge that a copy of this consent can be made available to me via my patient portal [facility] MyChart), or I can request a printed copy by calling the office of [facility].    I understand that my insurance will be billed for this visit.   I have read or had this consent read to me. I understand the contents of this consent, which adequately explains the benefits and risks of the Services being provided via telemedicine.  I have been provided ample opportunity to ask questions regarding this consent and the Services and have had my questions answered to my satisfaction. I give my informed consent for the services to be provided through the use of telemedicine in my medical care

## 2024-01-26 NOTE — Telephone Encounter (Signed)
   Name: Cory Valentine  DOB: 1956/02/22  MRN: [id number]  Primary Cardiologist: Vishnu P Mallipeddi, MD   Preoperative HeartCare team, please contact this patient and set up a phone call appointment for further preoperative risk assessment. Please obtain consent and complete medication review. Thank you for your help.  Given time sensitive nature, will concurrently route to pharmacy team to help assess anticoagulation recommendation.  I also confirmed the patient resides in the state of [place] . As per [facility] Medical Board telemedicine laws, the patient must reside in the state in which the provider is licensed.  I will route faxed update to requesting provider to let them know virtual visit is forthcoming for surgical risk assessment.   Raphael LOISE Bring, PA-C 01/26/2024, 3:50 PM [facility]

## 2024-01-26 NOTE — Telephone Encounter (Signed)
 Attempted to contact patient regarding surgical questions. Left VM.  Will have CMA Coordinate a telephone visit  Electronically signed by:  Elspeth Coddington, MD  Facial Plastic & Reconstructive Surgery Otolaryngology - Head and Neck Surgery [facility] [place]

## 2024-01-26 NOTE — Telephone Encounter (Signed)
 Cory Valentine with [facility] is following up requesting updates. Procedure has now been scheduled for 11/24. Please advise.

## 2024-01-26 NOTE — Telephone Encounter (Signed)
 Notified pharm team by chat to review prior to 11/18 VV

## 2024-01-26 NOTE — Telephone Encounter (Signed)
Patient has been scheduled for televisit.

## 2024-01-27 NOTE — Telephone Encounter (Signed)
 Patient with diagnosis of afib on Eliquis  for anticoagulation.    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 The patient's score is based upon: CHF History: 1 HTN History: 1 Diabetes History: 0 Stroke History: 0 Vascular Disease History: 1 Age Score: 1 Gender Score: 0      CrCl 41 ml/min Platelet count 265  Patient has not had an Afib/aflutter ablation in the last 3 months, DCCV within the last 4 weeks or a watchman implanted in the last 45 days   Per office protocol, patient can hold Eliquis  for 2 days prior to procedure.    **This guidance is not considered finalized until pre-operative APP has relayed final recommendations.**

## 2024-01-28 ENCOUNTER — Other Ambulatory Visit: Payer: Self-pay | Admitting: Family Medicine

## 2024-01-28 DIAGNOSIS — I509 Heart failure, unspecified: Secondary | ICD-10-CM

## 2024-01-28 DIAGNOSIS — I4891 Unspecified atrial fibrillation: Secondary | ICD-10-CM

## 2024-01-31 ENCOUNTER — Other Ambulatory Visit: Payer: Self-pay | Admitting: Otolaryngology
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
## 2024-02-01 ENCOUNTER — Ambulatory Visit: Payer: Medicare (Managed Care) | Attending: Internal Medicine

## 2024-02-01 DIAGNOSIS — Z01810 Encounter for preprocedural cardiovascular examination: Secondary | ICD-10-CM | POA: Diagnosis not present

## 2024-02-02 ENCOUNTER — Encounter (HOSPITAL_COMMUNITY): Payer: Self-pay

## 2024-02-02 NOTE — Pre-Procedure Instructions (Signed)
 Surgical Instructions   Your procedure is scheduled on February 07, 2024. Report to [facility] Main Entrance A at 1:00 P.M., then check in with the Admitting office. Any questions or running late day of surgery: call [phone]  Questions prior to your surgery date: call [phone], Monday-Friday, 8am-4pm. If you experience any cold or flu symptoms such as cough, fever, chills, shortness of breath, etc. between now and your scheduled surgery, please notify us  at the above number.     Remember:  Do not eat after midnight the night before your surgery   You may drink clear liquids until 12:00 PM the afternoon of your surgery.   Clear liquids allowed are: Water, Non-Citrus Juices (without pulp), Carbonated Beverages, Clear Tea (no milk, honey, etc.), Black Coffee Only (NO MILK, CREAM OR POWDERED CREAMER of any kind), and Gatorade.    Take these medicines the morning of surgery with A SIP OF WATER: atorvastatin  (LIPITOR)  metoprolol  succinate (TOPROL -XL)    STOP taking your ELIQUIS  two days prior to surgery. Your last dose will be November 21st.   One week prior to surgery, STOP taking any Aspirin  (unless otherwise instructed by your surgeon) Aleve, Naproxen, Ibuprofen, Motrin, Advil, Goody's, BC's, all herbal medications, fish oil, and non-prescription vitamins.                     Do NOT Smoke (Tobacco/Vaping) for 24 hours prior to your procedure.  If you use a CPAP at night, you may bring your mask/headgear for your overnight stay.   You will be asked to remove any contacts, glasses, piercing's, hearing aid's, dentures/partials prior to surgery. Please bring cases for these items if needed.    Patients discharged the day of surgery will not be allowed to drive home, and someone needs to stay with them for 24 hours.  SURGICAL WAITING ROOM VISITATION Patients may have no more than 2 support people in the waiting area - these visitors may rotate.   Pre-op nurse will  coordinate an appropriate time for 1 ADULT support person, who may not rotate, to accompany patient in pre-op.  Children under the age of 28 must have an adult with them who is not the patient and must remain in the main waiting area with an adult.  If the patient needs to stay at the hospital during part of their recovery, the visitor guidelines for inpatient rooms apply.  Please refer to the [facility] website for the visitor guidelines for any additional information.   If you received a COVID test during your pre-op visit  it is requested that you wear a mask when out in public, stay away from anyone that may not be feeling well and notify your surgeon if you develop symptoms. If you have been in contact with anyone that has tested positive in the last 10 days please notify you surgeon.      Pre-operative CHG Bathing Instructions   You can play a key role in reducing the risk of infection after surgery. Your skin needs to be as free of germs as possible. You can reduce the number of germs on your skin by washing with CHG (chlorhexidine  gluconate) soap before surgery. CHG is an antiseptic soap that kills germs and continues to kill germs even after washing.   DO NOT use if you have an allergy to chlorhexidine /CHG or antibacterial soaps. If your skin becomes reddened or irritated, stop using the CHG and notify one of our RNs at [phone].  TAKE A SHOWER THE NIGHT BEFORE SURGERY   Please keep in mind the following:  DO NOT shave, including legs and underarms, 48 hours prior to surgery.   You may shave your face before/day of surgery.  Place clean sheets on your bed the night before surgery Use a clean washcloth (not used since being washed) for shower. DO NOT sleep with pet's night before surgery.  CHG Shower Instructions:  Wash your face and private area with normal soap. If you choose to wash your hair, wash first with your normal shampoo.  After you use shampoo/soap,  rinse your hair and body thoroughly to remove shampoo/soap residue.  Turn the water OFF and apply half the bottle of CHG soap to a CLEAN washcloth.  Apply CHG soap ONLY FROM YOUR NECK DOWN TO YOUR TOES (washing for 3-5 minutes)  DO NOT use CHG soap on face, private areas, open wounds, or sores.  Pay special attention to the area where your surgery is being performed.  If you are having back surgery, having someone wash your back for you may be helpful. Wait 2 minutes after CHG soap is applied, then you may rinse off the CHG soap.  Pat dry with a clean towel  Put on clean pajamas    Additional instructions for the day of surgery: If you choose, you may shower the morning of surgery with an antibacterial soap.  DO NOT APPLY any lotions, deodorants, cologne, or perfumes.   Do not wear jewelry or makeup Do not wear nail polish, gel polish, artificial nails, or any other type of covering on natural nails (fingers and toes) Do not bring valuables to the hospital. [facility] is not responsible for valuables/personal belongings. Put on clean/comfortable clothes.  Please brush your teeth.  Ask your nurse before applying any prescription medications to the skin.

## 2024-02-03 ENCOUNTER — Other Ambulatory Visit: Payer: Self-pay

## 2024-02-03 ENCOUNTER — Encounter (HOSPITAL_COMMUNITY): Payer: Self-pay

## 2024-02-03 ENCOUNTER — Encounter (HOSPITAL_COMMUNITY)
Admission: RE | Admit: 2024-02-03 | Discharge: 2024-02-03 | Disposition: A | Discharge status: Home / Self Care | Payer: Medicare (Managed Care) | Source: Ambulatory Visit | Attending: Otolaryngology | Admitting: Otolaryngology

## 2024-02-03 VITALS — BP 142/79 | HR 85 | Temp 98.5°F | Resp 18 | Ht 73.0 in | Wt 177.8 lb

## 2024-02-03 DIAGNOSIS — F1090 Alcohol use, unspecified, uncomplicated: Secondary | ICD-10-CM | POA: Diagnosis not present

## 2024-02-03 DIAGNOSIS — I252 Old myocardial infarction: Secondary | ICD-10-CM | POA: Insufficient documentation

## 2024-02-03 DIAGNOSIS — C44121 Squamous cell carcinoma of skin of unspecified eyelid, including canthus: Secondary | ICD-10-CM | POA: Insufficient documentation

## 2024-02-03 DIAGNOSIS — N189 Chronic kidney disease, unspecified: Secondary | ICD-10-CM | POA: Insufficient documentation

## 2024-02-03 DIAGNOSIS — Z01812 Encounter for preprocedural laboratory examination: Secondary | ICD-10-CM | POA: Insufficient documentation

## 2024-02-03 DIAGNOSIS — Z955 Presence of coronary angioplasty implant and graft: Secondary | ICD-10-CM | POA: Diagnosis not present

## 2024-02-03 DIAGNOSIS — M952 Other acquired deformity of head: Secondary | ICD-10-CM | POA: Diagnosis not present

## 2024-02-03 DIAGNOSIS — C44329 Squamous cell carcinoma of skin of other parts of face: Secondary | ICD-10-CM | POA: Insufficient documentation

## 2024-02-03 DIAGNOSIS — I255 Ischemic cardiomyopathy: Secondary | ICD-10-CM | POA: Diagnosis not present

## 2024-02-03 DIAGNOSIS — I5020 Unspecified systolic (congestive) heart failure: Secondary | ICD-10-CM | POA: Diagnosis not present

## 2024-02-03 DIAGNOSIS — I480 Paroxysmal atrial fibrillation: Secondary | ICD-10-CM | POA: Diagnosis not present

## 2024-02-03 DIAGNOSIS — N400 Enlarged prostate without lower urinary tract symptoms: Secondary | ICD-10-CM | POA: Diagnosis not present

## 2024-02-03 DIAGNOSIS — I2510 Atherosclerotic heart disease of native coronary artery without angina pectoris: Secondary | ICD-10-CM | POA: Insufficient documentation

## 2024-02-03 DIAGNOSIS — I130 Hypertensive heart and chronic kidney disease with heart failure and stage 1 through stage 4 chronic kidney disease, or unspecified chronic kidney disease: Secondary | ICD-10-CM | POA: Diagnosis not present

## 2024-02-03 DIAGNOSIS — Z01818 Encounter for other preprocedural examination: Secondary | ICD-10-CM

## 2024-02-03 HISTORY — DX: Paroxysmal atrial fibrillation: I48.0

## 2024-02-03 HISTORY — DX: Atherosclerotic heart disease of native coronary artery without angina pectoris: I25.10

## 2024-02-03 HISTORY — DX: Chronic kidney disease, unspecified: N18.9

## 2024-02-03 HISTORY — DX: Ischemic cardiomyopathy: I25.5

## 2024-02-03 HISTORY — DX: Malignant (primary) neoplasm, unspecified: C80.1

## 2024-02-03 HISTORY — DX: Benign prostatic hyperplasia without lower urinary tract symptoms: N40.0

## 2024-02-03 HISTORY — DX: Heart failure, unspecified: I50.9

## 2024-02-03 LAB — COMPREHENSIVE METABOLIC PANEL WITH GFR
ALT: 14 U/L (ref 0–44)
AST: 27 U/L (ref 15–41)
Albumin: 3.9 g/dL (ref 3.5–5.0)
Alkaline Phosphatase: 108 U/L (ref 38–126)
Anion gap: 12 (ref 5–15)
BUN: 25 mg/dL — ABNORMAL HIGH (ref 8–23)
CO2: 30 mmol/L (ref 22–32)
Calcium: 9.3 mg/dL (ref 8.9–10.3)
Chloride: 89 mmol/L — ABNORMAL LOW (ref 98–111)
Creatinine, Ser: 1.82 mg/dL — ABNORMAL HIGH (ref 0.61–1.24)
GFR, Estimated: 39 mL/min — ABNORMAL LOW (ref >60)
Glucose, Bld: 106 mg/dL — ABNORMAL HIGH (ref 70–99)
Potassium: 4.2 mmol/L (ref 3.5–5.1)
Sodium: 131 mmol/L — ABNORMAL LOW (ref 135–145)
Total Bilirubin: 0.7 mg/dL (ref 0.0–1.2)
Total Protein: 7.7 g/dL (ref 6.5–8.1)

## 2024-02-03 LAB — CBC
HCT: 43.6 % (ref 39.0–52.0)
Hemoglobin: 15.5 g/dL (ref 13.0–17.0)
MCH: 35.1 pg — ABNORMAL HIGH (ref 26.0–34.0)
MCHC: 35.6 g/dL (ref 30.0–36.0)
MCV: 98.6 fL (ref 80.0–100.0)
Platelets: 230 K/uL (ref 150–400)
RBC: 4.42 MIL/uL (ref 4.22–5.81)
RDW: 13.5 % (ref 11.5–15.5)
WBC: 6.9 K/uL (ref 4.0–10.5)
nRBC: 0 % (ref 0.0–0.2)

## 2024-02-03 NOTE — Progress Notes (Addendum)
 PCP - Dr. Butler Burr Cardiologist - Dr. Vishnu Mallipeddi - last office visit 07/29/2023  PPM/ICD - Denies Device Orders - n/a Rep Notified - n/a  Chest x-ray - n/a EKG - 07/29/2023 Stress Test - 04/28/2022 ECHO - 08/17/2023 Cardiac Cath - 01/13/2011 - DES placed  Sleep Study - Denies CPAP - n/a  No DM  Last dose of GLP1 agonist- n/a GLP1 instructions: n/a  Blood Thinner Instructions: Per surgeon, pt is to hold Eliquis  for two days prior to surgery. Last dose will be November 21st Aspirin  Instructions: n/a  ERAS Protcol - Clear liquids until 1200 afternoon of surgery. Per Dr. Luciano and Lynwood Hope, PA-C, pt is allowed a Light meal being like a piece of toast, or a banana, or an egg. Something very small, and it would need to be eaten by 9am at the latest. Pt states he can follow those instructions and will eat around 0530-0600  COVID TEST- n/a   Anesthesia review: Yes. Cardiac clearance.   Patient denies shortness of breath, fever, cough and chest pain at PAT appointment. Pt denies any respiratory illness/infection in the last two months.   All instructions explained to the patient, with a verbal understanding of the material. Patient agrees to go over the instructions while at home for a better understanding. Patient also instructed to self quarantine after being tested for COVID-19. The opportunity to ask questions was provided.

## 2024-02-04 ENCOUNTER — Encounter (HOSPITAL_COMMUNITY): Payer: Self-pay

## 2024-02-04 NOTE — Anesthesia Preprocedure Evaluation (Signed)
 Anesthesia Evaluation  Patient identified by MRN, date of birth, ID band Patient awake    Reviewed: Allergy & Precautions, NPO status , Patient's Chart, lab work & pertinent test results, reviewed documented beta blocker date and time   Airway Mallampati: I  TM Distance: >3 FB Neck ROM: Full    Dental  (+) Teeth Intact, Dental Advisory Given   Pulmonary COPD, Current Smoker   breath sounds clear to auscultation       Cardiovascular hypertension, Pt. on medications and Pt. on home beta blockers pulmonary hypertension (mild pHTN on echo)+ CAD, + Past MI, + Cardiac Stents (DES LAD 2012) and +CHF (LVEF 45-50%)  + dysrhythmias (eliquis  LD 11/21) Atrial Fibrillation  Rhythm:Regular Rate:Normal  Echo 08/17/2023: IMPRESSIONS   1. No LV thrombus by Definity . Left ventricular ejection fraction, by  estimation, is 45 to 50%. The left ventricle has mildly decreased  function. The left ventricle demonstrates regional wall motion  abnormalities (see scoring diagram/findings for description). The entire septum, entire inferior wall, and apex are hypokinetic. The entire anterior wall and entire lateral wall are normal  Left ventricular diastolic parameters are consistent with Grade I diastolic dysfunction (impaired relaxation).   2. Right ventricular systolic function is normal. The right ventricular  size is mildly enlarged. There is mildly elevated pulmonary artery  systolic pressure.   3. The mitral valve is normal in structure. No evidence of mitral valve  regurgitation. No evidence of mitral stenosis.   4. The aortic valve is tricuspid. Aortic valve regurgitation is not  visualized. No aortic stenosis is present.   5. The inferior vena cava is normal in size with greater than 50%  respiratory variability, suggesting right atrial pressure of 3 mmHg.  - Comparison(s): A prior study was performed on 04/08/2022. EF 40-45% with regional wall motuion  abnormalities. Mild mitral regurgitation. Right and left atriums are moderately dilated.  - Comparison TTE 04/08/2022: LVEF40-45% with distal septal and apical hypokinesis, mild MR, mild-moderate TR; 03/03/2016: LVEF 45-50%, hypokinesis of the mid-apical  anteroseptal and inferoseptal myocardium and apical anterior myocardium, apical akinesis, apex, mid-apical anteroseptum and inferoseptum are hyperechoic, consistent with prior infarct, trivial TR, PA peak pressure 24 mmHg; 03/24/2010: LVEF > 55%, moderate apicoseptal hypokinesis; LVEF 40-45% with sevre anteroapical hypokinesis by 01/13/2011 in setting of anterior STEMI s/p DES LAD     Nuclear stress test 04/28/2022:   Findings are consistent with infarction. The study is intermediate risk.   No ST deviation was noted. The ECG was negative for ischemia.  Intermediate risk Duke treadmill score of 4.   LV perfusion is abnormal.  Moderate sized, moderate to severe intensity, anterior apical/anteroseptal defect that is fixed and consistent with infarct scar.   Left ventricular function is abnormal. Nuclear stress EF: 49 %. Intermediate risk study with evidence of apical anterior/anteroseptal infarct scar and LVEF 49% with associated wall motion abnormality.  No substantial ischemic territories noted.     Cardiac cath 01/13/2011: 1. Left main normal. 2. LAD; occluded after the first septal perforator. 3. Left circumflex; dominant and free of significant disease. 4. Right coronary artery; nondominant with 60% stenosis in the     midportion after a small marginal branch. 5. Left ventriculography;  RAO left ventriculogram was performed using     25 mL of Visipaque dye at 12 mL/second.  The overall LVEF was     estimated at 40-45% with severe anteroapical hypokinesia. - S/p DES LAD.    Neuro/Psych negative neurological ROS  negative psych  ROS   GI/Hepatic negative GI ROS,,,(+)     substance abuse (42 drinks/wk)  alcohol use  Endo/Other   negative endocrine ROS    Renal/GU CRFRenal disease (cr 1.82) Bladder dysfunction   BPH (with incomplete bladder emptying/overflow incontinence with history of intermittent self-cath)    Musculoskeletal negative musculoskeletal ROS (+)    Abdominal   Peds  Hematology negative hematology ROS (+)   Anesthesia Other Findings   Reproductive/Obstetrics negative OB ROS                              Anesthesia Physical Anesthesia Plan  ASA: 3  Anesthesia Plan: General   Post-op Pain Management: Tylenol  PO (pre-op)*   Induction: Intravenous  PONV Risk Score and Plan: 1 and Ondansetron , Dexamethasone , Treatment may vary due to age or medical condition and Midazolam   Airway Management Planned: Oral ETT  Additional Equipment: None  Intra-op Plan:   Post-operative Plan: Extubation in OR  Informed Consent: I have reviewed the patients History and Physical, chart, labs and discussed the procedure including the risks, benefits and alternatives for the proposed anesthesia with the patient or authorized representative who has indicated his/her understanding and acceptance.     Dental advisory given  Plan Discussed with:   Anesthesia Plan Comments: (PAT note written 02/04/2024 by Isaiah Ruder, PA-C.  )         Anesthesia Quick Evaluation
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
## 2024-02-07 ENCOUNTER — Encounter (HOSPITAL_COMMUNITY)
Admission: RE | Disposition: A | Discharge status: Home / Self Care | Payer: Self-pay | Source: Home / Self Care | Attending: Otolaryngology

## 2024-02-07 ENCOUNTER — Other Ambulatory Visit: Payer: Self-pay

## 2024-02-07 ENCOUNTER — Ambulatory Visit (HOSPITAL_COMMUNITY)
Admission: RE | Admit: 2024-02-07 | Discharge: 2024-02-07 | Disposition: A | Discharge status: Home / Self Care | Payer: Medicare (Managed Care) | Attending: Otolaryngology | Admitting: Otolaryngology

## 2024-02-07 ENCOUNTER — Encounter (HOSPITAL_COMMUNITY): Payer: Self-pay | Admitting: Otolaryngology

## 2024-02-07 ENCOUNTER — Ambulatory Visit (HOSPITAL_COMMUNITY): Payer: Medicare (Managed Care) | Admitting: Anesthesiology

## 2024-02-07 ENCOUNTER — Ambulatory Visit (HOSPITAL_COMMUNITY): Payer: Medicare (Managed Care) | Admitting: Vascular Surgery

## 2024-02-07 DIAGNOSIS — F17210 Nicotine dependence, cigarettes, uncomplicated: Secondary | ICD-10-CM | POA: Insufficient documentation

## 2024-02-07 DIAGNOSIS — I509 Heart failure, unspecified: Secondary | ICD-10-CM | POA: Insufficient documentation

## 2024-02-07 DIAGNOSIS — I272 Pulmonary hypertension, unspecified: Secondary | ICD-10-CM | POA: Diagnosis not present

## 2024-02-07 DIAGNOSIS — Z955 Presence of coronary angioplasty implant and graft: Secondary | ICD-10-CM | POA: Diagnosis not present

## 2024-02-07 DIAGNOSIS — C44329 Squamous cell carcinoma of skin of other parts of face: Secondary | ICD-10-CM | POA: Diagnosis not present

## 2024-02-07 DIAGNOSIS — I252 Old myocardial infarction: Secondary | ICD-10-CM | POA: Insufficient documentation

## 2024-02-07 DIAGNOSIS — F17200 Nicotine dependence, unspecified, uncomplicated: Secondary | ICD-10-CM

## 2024-02-07 DIAGNOSIS — C792 Secondary malignant neoplasm of skin: Secondary | ICD-10-CM | POA: Insufficient documentation

## 2024-02-07 DIAGNOSIS — J449 Chronic obstructive pulmonary disease, unspecified: Secondary | ICD-10-CM | POA: Diagnosis not present

## 2024-02-07 DIAGNOSIS — N189 Chronic kidney disease, unspecified: Secondary | ICD-10-CM | POA: Diagnosis not present

## 2024-02-07 DIAGNOSIS — I2510 Atherosclerotic heart disease of native coronary artery without angina pectoris: Secondary | ICD-10-CM | POA: Diagnosis not present

## 2024-02-07 DIAGNOSIS — C441292 Squamous cell carcinoma of skin of left lower eyelid, including canthus: Secondary | ICD-10-CM | POA: Diagnosis not present

## 2024-02-07 DIAGNOSIS — M952 Other acquired deformity of head: Secondary | ICD-10-CM | POA: Diagnosis not present

## 2024-02-07 DIAGNOSIS — I5041 Acute combined systolic (congestive) and diastolic (congestive) heart failure: Secondary | ICD-10-CM | POA: Diagnosis not present

## 2024-02-07 DIAGNOSIS — I4819 Other persistent atrial fibrillation: Secondary | ICD-10-CM

## 2024-02-07 DIAGNOSIS — I130 Hypertensive heart and chronic kidney disease with heart failure and stage 1 through stage 4 chronic kidney disease, or unspecified chronic kidney disease: Secondary | ICD-10-CM | POA: Diagnosis not present

## 2024-02-07 DIAGNOSIS — N400 Enlarged prostate without lower urinary tract symptoms: Secondary | ICD-10-CM | POA: Insufficient documentation

## 2024-02-07 DIAGNOSIS — C44320 Squamous cell carcinoma of skin of unspecified parts of face: Secondary | ICD-10-CM

## 2024-02-07 DIAGNOSIS — I110 Hypertensive heart disease with heart failure: Secondary | ICD-10-CM

## 2024-02-07 DIAGNOSIS — C44121 Squamous cell carcinoma of skin of unspecified eyelid, including canthus: Secondary | ICD-10-CM

## 2024-02-07 HISTORY — PX: EXCISION MASS HEAD: SHX6702

## 2024-02-07 SURGERY — EXCISION, MASS, HEAD
Anesthesia: General | Laterality: Left

## 2024-02-07 MED ORDER — FENTANYL CITRATE (PF) 100 MCG/2ML IJ SOLN
INTRAMUSCULAR | Status: AC
  Filled 2024-02-07: qty 2

## 2024-02-07 MED ORDER — LIDOCAINE 2% (20 MG/ML) 5 ML SYRINGE
INTRAMUSCULAR | Status: AC
  Filled 2024-02-07: qty 5

## 2024-02-07 MED ORDER — ALBUTEROL SULFATE HFA 108 (90 BASE) MCG/ACT IN AERS
INHALATION_SPRAY | RESPIRATORY_TRACT | Status: DC | PRN
  Administered 2024-02-07 (×2): 3 via RESPIRATORY_TRACT

## 2024-02-07 MED ORDER — BACITRACIN ZINC 500 UNIT/GM EX OINT
TOPICAL_OINTMENT | CUTANEOUS | Status: DC | PRN
  Administered 2024-02-07: 1 via TOPICAL

## 2024-02-07 MED ORDER — APIXABAN 5 MG PO TABS: 5.0000 mg | ORAL_TABLET | Freq: Two times a day (BID) | ORAL | 0 refills | Status: DC

## 2024-02-07 MED ORDER — HYDROCODONE-ACETAMINOPHEN 5-325 MG PO TABS: 1.0000 | ORAL_TABLET | Freq: Four times a day (QID) | ORAL | 0 refills | Status: AC | PRN

## 2024-02-07 MED ORDER — DEXAMETHASONE SOD PHOSPHATE PF 10 MG/ML IJ SOLN
INTRAMUSCULAR | Status: DC | PRN
  Administered 2024-02-07: 10 mg via INTRAVENOUS

## 2024-02-07 MED ORDER — ROCURONIUM BROMIDE 10 MG/ML (PF) SYRINGE
PREFILLED_SYRINGE | INTRAVENOUS | Status: DC | PRN
  Administered 2024-02-07: 40 mg via INTRAVENOUS
  Administered 2024-02-07: 10 mg via INTRAVENOUS

## 2024-02-07 MED ORDER — ONDANSETRON HCL 4 MG/2ML IJ SOLN
INTRAMUSCULAR | Status: DC | PRN
  Administered 2024-02-07: 4 mg via INTRAVENOUS

## 2024-02-07 MED ORDER — ORAL CARE MOUTH RINSE: 15.0000 mL | Freq: Once | OROMUCOSAL | Status: AC

## 2024-02-07 MED ORDER — 0.9 % SODIUM CHLORIDE (POUR BTL) OPTIME
TOPICAL | Status: DC | PRN
  Administered 2024-02-07: 1000 mL

## 2024-02-07 MED ORDER — EPHEDRINE SULFATE-NACL 50-0.9 MG/10ML-% IV SOSY
PREFILLED_SYRINGE | INTRAVENOUS | Status: DC | PRN
  Administered 2024-02-07: 5 mg via INTRAVENOUS

## 2024-02-07 MED ORDER — OXYCODONE HCL 5 MG PO TABS: 5.0000 mg | ORAL_TABLET | Freq: Once | ORAL | Status: DC | PRN

## 2024-02-07 MED ORDER — CHLORHEXIDINE GLUCONATE 0.12 % MT SOLN
15.0000 mL | Freq: Once | OROMUCOSAL | Status: AC
  Administered 2024-02-07: 15 mL via OROMUCOSAL
  Filled 2024-02-07: qty 15

## 2024-02-07 MED ORDER — LACTATED RINGERS IV SOLN: INTRAVENOUS | Status: DC

## 2024-02-07 MED ORDER — PHENYLEPHRINE 80 MCG/ML (10ML) SYRINGE FOR IV PUSH (FOR BLOOD PRESSURE SUPPORT)
PREFILLED_SYRINGE | INTRAVENOUS | Status: AC
  Filled 2024-02-07: qty 20

## 2024-02-07 MED ORDER — PROPOFOL 10 MG/ML IV BOLUS
INTRAVENOUS | Status: AC
  Filled 2024-02-07: qty 20

## 2024-02-07 MED ORDER — FENTANYL CITRATE (PF) 250 MCG/5ML IJ SOLN
INTRAMUSCULAR | Status: DC | PRN
  Administered 2024-02-07: 25 ug via INTRAVENOUS
  Administered 2024-02-07: 50 ug via INTRAVENOUS
  Administered 2024-02-07: 25 ug via INTRAVENOUS

## 2024-02-07 MED ORDER — ALBUTEROL SULFATE HFA 108 (90 BASE) MCG/ACT IN AERS
INHALATION_SPRAY | RESPIRATORY_TRACT | Status: AC
  Filled 2024-02-07: qty 6.7

## 2024-02-07 MED ORDER — ONDANSETRON HCL 4 MG/2ML IJ SOLN: 4.0000 mg | Freq: Once | INTRAMUSCULAR | Status: DC | PRN

## 2024-02-07 MED ORDER — LIDOCAINE 2% (20 MG/ML) 5 ML SYRINGE
INTRAMUSCULAR | Status: DC | PRN
  Administered 2024-02-07: 40 mg via INTRAVENOUS

## 2024-02-07 MED ORDER — PHENYLEPHRINE 80 MCG/ML (10ML) SYRINGE FOR IV PUSH (FOR BLOOD PRESSURE SUPPORT)
PREFILLED_SYRINGE | INTRAVENOUS | Status: DC | PRN
  Administered 2024-02-07 (×2): 80 ug via INTRAVENOUS

## 2024-02-07 MED ORDER — MIDAZOLAM HCL 2 MG/2ML IJ SOLN
INTRAMUSCULAR | Status: AC
  Filled 2024-02-07: qty 2

## 2024-02-07 MED ORDER — ARTIFICIAL TEARS OPHTHALMIC OINT
TOPICAL_OINTMENT | OPHTHALMIC | Status: AC
  Filled 2024-02-07: qty 3.5

## 2024-02-07 MED ORDER — AMISULPRIDE (ANTIEMETIC) 5 MG/2ML IV SOLN: 10.0000 mg | Freq: Once | INTRAVENOUS | Status: DC | PRN

## 2024-02-07 MED ORDER — HYDROMORPHONE HCL 1 MG/ML IJ SOLN: 0.2500 mg | INTRAMUSCULAR | Status: DC | PRN

## 2024-02-07 MED ORDER — EPHEDRINE 5 MG/ML INJ
INTRAVENOUS | Status: AC
  Filled 2024-02-07: qty 5

## 2024-02-07 MED ORDER — SUGAMMADEX SODIUM 200 MG/2ML IV SOLN
INTRAVENOUS | Status: DC | PRN
  Administered 2024-02-07: 160 mg via INTRAVENOUS

## 2024-02-07 MED ORDER — PROPOFOL 10 MG/ML IV BOLUS
INTRAVENOUS | Status: DC | PRN
  Administered 2024-02-07: 140 mg via INTRAVENOUS

## 2024-02-07 MED ORDER — LIDOCAINE-EPINEPHRINE 1 %-1:100000 IJ SOLN
INTRAMUSCULAR | Status: DC | PRN
  Administered 2024-02-07: 10 mL

## 2024-02-07 MED ORDER — CEFAZOLIN SODIUM-DEXTROSE 2-4 GM/100ML-% IV SOLN
2.0000 g | INTRAVENOUS | Status: AC
  Administered 2024-02-07: 2 g via INTRAVENOUS
  Filled 2024-02-07: qty 100

## 2024-02-07 MED ORDER — ACETAMINOPHEN 500 MG PO TABS
1000.0000 mg | ORAL_TABLET | Freq: Once | ORAL | Status: AC
  Administered 2024-02-07: 1000 mg via ORAL
  Filled 2024-02-07: qty 2

## 2024-02-07 MED ORDER — MIDAZOLAM HCL (PF) 2 MG/2ML IJ SOLN
INTRAMUSCULAR | Status: DC | PRN
  Administered 2024-02-07: 2 mg via INTRAVENOUS

## 2024-02-07 MED ORDER — OXYCODONE HCL 5 MG/5ML PO SOLN: 5.0000 mg | Freq: Once | ORAL | Status: DC | PRN

## 2024-02-07 MED ORDER — BACITRACIN ZINC 500 UNIT/GM EX OINT
TOPICAL_OINTMENT | CUTANEOUS | Status: AC
  Filled 2024-02-07: qty 28.35

## 2024-02-07 SURGICAL SUPPLY — 37 items
APPLICATOR DR MATTHEWS STRL (MISCELLANEOUS) ×2 IMPLANT
BAG COUNTER SPONGE SURGICOUNT (BAG) ×2 IMPLANT
BLADE SURG 15 STRL LF DISP TIS (BLADE) IMPLANT
CANISTER SUCTION 3000ML PPV (SUCTIONS) ×2 IMPLANT
CLEANER TIP ELECTROSURG 2X2 (MISCELLANEOUS) ×2 IMPLANT
CORD BIPOLAR FORCEPS 12FT (ELECTRODE) IMPLANT
COVER SURGICAL LIGHT HANDLE (MISCELLANEOUS) ×2 IMPLANT
DRAPE HALF SHEET 40X57 (DRAPES) IMPLANT
DRSG TELFA 3X8 NADH STRL (GAUZE/BANDAGES/DRESSINGS) IMPLANT
ELECT COATED BLADE 2.86 ST (ELECTRODE) ×2 IMPLANT
ELECT NDL BLADE 2-5/6 (NEEDLE) IMPLANT
GAUZE 4X4 16PLY ~~LOC~~+RFID DBL (SPONGE) ×2 IMPLANT
GAUZE XEROFORM 1X8 LF (GAUZE/BANDAGES/DRESSINGS) IMPLANT
GLOVE BIO SURGEON STRL SZ7.5 (GLOVE) ×2 IMPLANT
GLOVE BIOGEL PI IND STRL 8 (GLOVE) ×2 IMPLANT
GOWN STRL REUS W/ TWL XL LVL3 (GOWN DISPOSABLE) ×2 IMPLANT
KIT BASIN OR (CUSTOM PROCEDURE TRAY) ×2 IMPLANT
KIT TURNOVER KIT B (KITS) ×2 IMPLANT
NDL HYPO 30X.5 LL (NEEDLE) ×2 IMPLANT
NDL PRECISIONGLIDE 27X1.5 (NEEDLE) IMPLANT
PAD ARMBOARD POSITIONER FOAM (MISCELLANEOUS) ×4 IMPLANT
PENCIL SMOKE EVACUATOR (MISCELLANEOUS) ×2 IMPLANT
SOLN 0.9% NACL POUR BTL 1000ML (IV SOLUTION) ×2 IMPLANT
STAPLER SKIN PROX 35W (STAPLE) ×2 IMPLANT
SUT CHROMIC 4 0 PS 2 18 (SUTURE) IMPLANT
SUT CHROMIC 4 0 RB 1X27 (SUTURE) IMPLANT
SUT CHROMIC 4 0 SH 27 (SUTURE) IMPLANT
SUT CHROMIC 5 0 RB 1 27 (SUTURE) IMPLANT
SUT ETHILON 6 0 9-3 1X18 BLK (SUTURE) IMPLANT
SUT ETHILON 6 0 P 1 (SUTURE) IMPLANT
SUT MON AB 5-0 P3 18 (SUTURE) IMPLANT
SUT PLAIN GUT FAST 5-0 (SUTURE) IMPLANT
SUT SILK 4 0 CR 8 RB 1 (SUTURE) IMPLANT
SUT VIC AB 4-0 PS2 27 (SUTURE) IMPLANT
SYR CONTROL 10ML LL (SYRINGE) ×2 IMPLANT
TRAY ENT MC OR (CUSTOM PROCEDURE TRAY) ×2 IMPLANT
TUBE CONNECTING 12X1/4 (SUCTIONS) ×2 IMPLANT

## 2024-02-07 NOTE — Anesthesia Procedure Notes (Signed)
 Procedure Name: Intubation Date/Time: 02/07/2024 3:39 PM  Performed by: Amarria Andreasen J, CRNAPre-anesthesia Checklist: Patient identified, Emergency Drugs available, Suction available and Patient being monitored Patient Re-evaluated:Patient Re-evaluated prior to induction Oxygen Delivery Method: Circle System Utilized Preoxygenation: Pre-oxygenation with 100% oxygen Induction Type: IV induction Ventilation: Mask ventilation without difficulty Laryngoscope Size: Miller and 3 Grade View: Grade I Tube type: Oral Tube size: 7.5 mm Number of attempts: 1 Airway Equipment and Method: Stylet and Oral airway Placement Confirmation: ETT inserted through vocal cords under direct vision, positive ETCO2 and breath sounds checked- equal and bilateral Secured at: 21 cm Tube secured with: Tape Dental Injury: Teeth and Oropharynx as per pre-operative assessment

## 2024-02-07 NOTE — Transfer of Care (Signed)
 Immediate Anesthesia Transfer of Care Note  Patient: MELTON WALLS  Procedure(s) Performed: EXCISION, MASS, HEAD (Left)  Patient Location: PACU  Anesthesia Type:General  Level of Consciousness: awake, alert , and oriented  Airway & Oxygen Therapy: Patient Spontanous Breathing and Patient connected to face mask oxygen  Post-op Assessment: Report given to RN and Post -op Vital signs reviewed and stable  Post vital signs: Reviewed and stable  Last Vitals:  Vitals Value Taken Time  BP 137/87 02/07/24 17:45  Temp 36.2 C 02/07/24 17:41  Pulse 99 02/07/24 17:50  Resp 14 02/07/24 17:50  SpO2 96 % 02/07/24 17:50  Vitals shown include unfiled device data.  Last Pain:  Vitals:   02/07/24 1741  TempSrc:   PainSc: 0-No pain         Complications: No notable events documented.

## 2024-02-07 NOTE — Op Note (Signed)
 OPERATIVE NOTE  Cory Valentine Date/Time of Admission: 02/07/2024 12:31 PM  CSN: [id number];FMW:[id number] Attending Provider: Luciano Standing, MD Room/Bed: MCPO/NONE DOB: [birth year] Age: 68 y.o.   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; Squamous cell carcinoma, eyelid, left; Acquired deformity of face  Procedure: Wide local excision malignant neoplasm left eyelid 2.1-3cm (CPT [phone]) Adjacent tissue transfer eyelid <10cm2 (CPT 14060)  Anesthesia: General  Surgeon(s): Standing KANDICE Luciano, MD  Staff: Circulator: Veda Verla SAILOR, RN Relief Circulator: Tharon Selinda GAILS, RN Relief Scrub: Gwenn Mardy SQUIBB, RN Scrub Person: Dyane No E  Implants: * No implants in log *  Specimens: ID Type Source Tests Collected by Time Destination  1 : wide local excision left cheek [facility] Tissue PATH ENT excision SURGICAL PATHOLOGY Luciano Standing, MD 02/07/2024 1603   2 : left cheek margin 12-3 Tissue PATH ENT excision SURGICAL PATHOLOGY Luciano Standing, MD 02/07/2024 1606   3 : left cheek margin 3-6 Tissue PATH ENT excision SURGICAL PATHOLOGY Luciano Standing, MD 02/07/2024 1609   4 : left cheek margin 6-9 Tissue PATH ENT excision SURGICAL PATHOLOGY Luciano Standing, MD 02/07/2024 1609   5 : left cheek margin 9-12 Tissue PATH ENT excision SURGICAL PATHOLOGY Luciano Standing, MD 02/07/2024 1609     Complications: none  EBL: 20 ML  IVF: Per anesthesia ML  Condition: stable  Operative Findings:  Squamous cell carcinoma left eyelid/medial cheek/lateral nasal sidewall excised to negative margins resulting in a 3x2.5cm ovoid defect repaired with cheek rotational advancement flap   Pre-exicision:   Post-Excision:   Post-repair:    Description of Operation: The patient was identified in the preoperative area and consent confirmed in the  chart.  He was brought to the operating by anesthesia and a preoperative timeout was performed confirming the patient's identity and procedure to be performed.  Once all were in agreement we proceeded with surgery.  General anesthesia was induced and the patient was intubated with an endotracheal tube.  The tube was secured.  The patient's bed was turned 90 degrees away from anesthesia.  The patient's left lower eyelid squamous cell carcinoma was marked out with 1 cm margins and the cheek with narrow margins and the eyelid intact approximately 5 mm.  An anticipated cheek rotational advancement flap was marked out between the eyelid and cheek subunit extending to the zygomatic region as well as releasing incision in the left nasolabial fold where a standing cutaneous deformity was anticipated.  Local anesthetic was infiltrated.  The patient was prepped and draped in standard sterile fashion for procedure of this kind.  A final preoperative pause was performed and we proceeded with surgery.    We began with wide local excision of the malignant neoplasm of the left eyelid .  A 15 blade was used to incise the skin down to the subcutaneous plane.  Due to the very thin tissue in the lower eyelid a small cuff of orbicularis oculi muscle was involved in the wide local excision specimen.  The specimen was dissected out sharply and with the use of judicious monopolar cautery removing it off of the orbicularis oculi.  The specimen was removed, marked with sutures and sent for permanent pathology.  Circumferential frozen section margins were taken all negative for carcinoma.  The resulting defect after margins was 3 x 2.5 cm down to the muscle layer.  Due to the large  defect adjacent tissue transfer was indicated neck.  I then proceeded with adjacent tissue transfer of the left cheek less than 10 cm.  A 15 blade was used to create the releasing incision between the eyelid and cheek subunit just lateral to the lateral  canthal region.  Double-pronged skin hooks were applied with transillumination of the cheek to allow for harvest of a appropriate thickness subcutaneous cheek rotational advancement flap.  Curved Metzenbaum scissors were used to incise the flap laterally along the cheek.  Once adequate tension was released the cheek was advanced into the lower eyelid/nasal sidewall defect and inset initially with buried interrupted 4-0 Vicryl sutures in this region, buried interrupted 5-0 Monocryl's along the eyelid repair.  Standing cutaneous deformity along the upper nasolabial fold was excised with a 15 blade and closed with buried interrupted 4-0 Vicryl sutures and interrupted 6 oh nylons.  Interrupted 6-0 nylon was placed along the nasal sidewall.  A running 5-0 fast gut was placed along the lower eyelid.  The flap had good color and capillary refill at the end of the procedure.  The wounds were then cleansed and dressed with bacitracin , Telfa and brown paper tape.  The patient was entered back to anesthesia extubated and brought to the cover room in stable condition.  Elspeth KANDICE Coddington, MD [facility]  02/07/2024

## 2024-02-07 NOTE — Discharge Instructions (Signed)
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
## 2024-02-08 ENCOUNTER — Encounter (HOSPITAL_COMMUNITY): Payer: Self-pay | Admitting: Otolaryngology

## 2024-02-08 NOTE — Anesthesia Postprocedure Evaluation (Signed)
 Anesthesia Post Note  Patient: Cory Valentine  Procedure(s) Performed: EXCISION, MASS, HEAD (Left)     Patient location during evaluation: PACU Anesthesia Type: General Level of consciousness: awake and alert Pain management: pain level controlled Vital Signs Assessment: post-procedure vital signs reviewed and stable Respiratory status: spontaneous breathing, nonlabored ventilation, respiratory function stable and patient connected to nasal cannula oxygen Cardiovascular status: blood pressure returned to baseline and stable Postop Assessment: no apparent nausea or vomiting Anesthetic complications: no   No notable events documented.            Rosslyn Pasion D Jahmier Willadsen

## 2024-02-09 LAB — SURGICAL PATHOLOGY

## 2024-02-14 DIAGNOSIS — M952 Other acquired deformity of head: Secondary | ICD-10-CM | POA: Diagnosis not present

## 2024-02-14 DIAGNOSIS — C44329 Squamous cell carcinoma of skin of other parts of face: Secondary | ICD-10-CM | POA: Diagnosis not present

## 2024-02-15 ENCOUNTER — Other Ambulatory Visit: Payer: Self-pay

## 2024-02-15 ENCOUNTER — Telehealth: Payer: Self-pay

## 2024-02-15 DIAGNOSIS — R399 Unspecified symptoms and signs involving the genitourinary system: Secondary | ICD-10-CM

## 2024-02-15 NOTE — Telephone Encounter (Signed)
 Dysuria  Patient called with c/o dysuria x 2-3 days days.  Pain: aching in abdomen Severity:5/10  Associated Signs and Symptoms:  Fever: noTemp.0 Chills: no Hematuria: no Urgency: yes Frequency: yes Hesitancy:no Incontinence: no Nausea: no Vomiting: no  Urologic History:  Any Recent Urologic Surgeries or Procedures:no Recurrent UTI's:yes Cystitis: no  Prostatitis:no Kidney or Bladder Stones: no Plan: Walk-in Clinic: no Appointment w/Physician: [no Lab visit scheduled for urine drop off: Yes Advice given:  Do you take on daily medications for UTI suppression No

## 2024-02-16 ENCOUNTER — Other Ambulatory Visit: Payer: Medicare (Managed Care)

## 2024-03-01 ENCOUNTER — Telehealth: Payer: Self-pay

## 2024-03-01 NOTE — Telephone Encounter (Signed)
° ° ° °  Will have MD Eskridge signed and faxed backed

## 2024-03-14 ENCOUNTER — Ambulatory Visit (INDEPENDENT_AMBULATORY_CARE_PROVIDER_SITE_OTHER): Payer: Medicare (Managed Care) | Admitting: Family Medicine

## 2024-03-14 ENCOUNTER — Encounter: Payer: Self-pay | Admitting: Family Medicine

## 2024-03-14 VITALS — BP 130/72 | HR 97 | Temp 98.2°F | Ht 73.0 in | Wt 181.5 lb

## 2024-03-14 DIAGNOSIS — I2510 Atherosclerotic heart disease of native coronary artery without angina pectoris: Secondary | ICD-10-CM | POA: Diagnosis not present

## 2024-03-14 DIAGNOSIS — I4819 Other persistent atrial fibrillation: Secondary | ICD-10-CM | POA: Diagnosis not present

## 2024-03-14 DIAGNOSIS — E7800 Pure hypercholesterolemia, unspecified: Secondary | ICD-10-CM

## 2024-03-14 DIAGNOSIS — N1832 Chronic kidney disease, stage 3b: Secondary | ICD-10-CM | POA: Diagnosis not present

## 2024-03-14 DIAGNOSIS — Z0001 Encounter for general adult medical examination with abnormal findings: Secondary | ICD-10-CM | POA: Diagnosis not present

## 2024-03-14 DIAGNOSIS — I509 Heart failure, unspecified: Secondary | ICD-10-CM | POA: Diagnosis not present

## 2024-03-14 DIAGNOSIS — Z0000 Encounter for general adult medical examination without abnormal findings: Secondary | ICD-10-CM

## 2024-03-14 DIAGNOSIS — Z122 Encounter for screening for malignant neoplasm of respiratory organs: Secondary | ICD-10-CM

## 2024-03-14 DIAGNOSIS — E871 Hypo-osmolality and hyponatremia: Secondary | ICD-10-CM | POA: Diagnosis not present

## 2024-03-14 LAB — COMPREHENSIVE METABOLIC PANEL WITH GFR
AG Ratio: 1.6 (calc) (ref 1.0–2.5)
ALT: 10 U/L (ref 9–46)
AST: 24 U/L (ref 10–35)
Albumin: 4.6 g/dL (ref 3.6–5.1)
Alkaline phosphatase (APISO): 143 U/L (ref 35–144)
BUN/Creatinine Ratio: 14 (calc) (ref 6–22)
BUN: 23 mg/dL (ref 7–25)
CO2: 33 mmol/L — ABNORMAL HIGH (ref 20–32)
Calcium: 9.5 mg/dL (ref 8.6–10.3)
Chloride: 90 mmol/L — ABNORMAL LOW (ref 98–110)
Creat: 1.68 mg/dL — ABNORMAL HIGH (ref 0.70–1.28)
Globulin: 2.9 g/dL (ref 1.9–3.7)
Glucose, Bld: 114 mg/dL — ABNORMAL HIGH (ref 65–99)
Potassium: 4.6 mmol/L (ref 3.5–5.3)
Sodium: 131 mmol/L — ABNORMAL LOW (ref 135–146)
Total Bilirubin: 0.9 mg/dL (ref 0.2–1.2)
Total Protein: 7.5 g/dL (ref 6.1–8.1)
eGFR: 43 mL/min/1.73m2 — ABNORMAL LOW

## 2024-03-14 LAB — LIPID PANEL
Cholesterol: 118 mg/dL
HDL: 78 mg/dL
LDL Cholesterol (Calc): 27 mg/dL
Non-HDL Cholesterol (Calc): 40 mg/dL
Total CHOL/HDL Ratio: 1.5 (calc)
Triglycerides: 53 mg/dL

## 2024-03-14 MED ORDER — APIXABAN 5 MG PO TABS: 5.0000 mg | ORAL_TABLET | Freq: Two times a day (BID) | ORAL | 3 refills | Status: AC

## 2024-03-14 MED ORDER — TADALAFIL 20 MG PO TABS: 10.0000 mg | ORAL_TABLET | ORAL | 11 refills | Status: AC | PRN
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
## 2024-03-17 ENCOUNTER — Ambulatory Visit: Payer: Self-pay | Admitting: Family Medicine

## 2024-03-22 ENCOUNTER — Telehealth: Payer: Self-pay | Admitting: Acute Care

## 2024-03-22 DIAGNOSIS — F17210 Nicotine dependence, cigarettes, uncomplicated: Secondary | ICD-10-CM

## 2024-03-22 DIAGNOSIS — Z87891 Personal history of nicotine dependence: Secondary | ICD-10-CM

## 2024-03-22 DIAGNOSIS — Z122 Encounter for screening for malignant neoplasm of respiratory organs: Secondary | ICD-10-CM
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
## 2024-03-27 ENCOUNTER — Encounter: Payer: Medicare (Managed Care) | Admitting: Adult Health

## 2024-04-03 ENCOUNTER — Ambulatory Visit (HOSPITAL_COMMUNITY): Payer: Medicare (Managed Care)

## 2024-04-21 ENCOUNTER — Other Ambulatory Visit: Payer: Self-pay | Admitting: Family Medicine

## 2024-04-21 DIAGNOSIS — E7800 Pure hypercholesterolemia, unspecified: Secondary | ICD-10-CM

## 2024-04-21 DIAGNOSIS — I2510 Atherosclerotic heart disease of native coronary artery without angina pectoris: Secondary | ICD-10-CM
